# Patient Record
Sex: Male | Born: 1955
Health system: Southern US, Community
[De-identification: ages and names within clinical notes are randomized; demographics above are authoritative.]

## PROBLEM LIST (undated history)

## (undated) DIAGNOSIS — R42 Dizziness and giddiness: Secondary | ICD-10-CM

## (undated) DIAGNOSIS — S76011A Strain of muscle, fascia and tendon of right hip, initial encounter: Secondary | ICD-10-CM

## (undated) DIAGNOSIS — E785 Hyperlipidemia, unspecified: Secondary | ICD-10-CM

## (undated) DIAGNOSIS — G4730 Sleep apnea, unspecified: Secondary | ICD-10-CM

## (undated) DIAGNOSIS — I4891 Unspecified atrial fibrillation: Secondary | ICD-10-CM

## (undated) DIAGNOSIS — F909 Attention-deficit hyperactivity disorder, unspecified type: Secondary | ICD-10-CM

## (undated) DIAGNOSIS — I10 Essential (primary) hypertension: Secondary | ICD-10-CM

## (undated) DIAGNOSIS — K219 Gastro-esophageal reflux disease without esophagitis: Secondary | ICD-10-CM

## (undated) DIAGNOSIS — N529 Male erectile dysfunction, unspecified: Secondary | ICD-10-CM

## (undated) HISTORY — DX: Strain of muscle, fascia and tendon of right hip, initial encounter: S76.011A

## (undated) HISTORY — DX: Unspecified atrial fibrillation: I48.91

## (undated) HISTORY — DX: Hyperlipidemia, unspecified: E78.5

## (undated) HISTORY — DX: Sleep apnea, unspecified: G47.30

## (undated) HISTORY — DX: Male erectile dysfunction, unspecified: N52.9

## (undated) HISTORY — DX: Dizziness and giddiness: R42

## (undated) HISTORY — DX: Attention-deficit hyperactivity disorder, unspecified type: F90.9

## (undated) HISTORY — DX: Gastro-esophageal reflux disease without esophagitis: K21.9

## (undated) HISTORY — DX: Essential (primary) hypertension: I10

---

## 2011-10-29 ENCOUNTER — Ambulatory Visit (HOSPITAL_BASED_OUTPATIENT_CLINIC_OR_DEPARTMENT_OTHER): Payer: BC Managed Care – PPO | Attending: Otolaryngology

## 2011-10-29 VITALS — Ht 67.0 in | Wt 210.0 lb

## 2011-10-29 DIAGNOSIS — G4733 Obstructive sleep apnea (adult) (pediatric): Secondary | ICD-10-CM | POA: Insufficient documentation

## 2011-11-07 DIAGNOSIS — R0609 Other forms of dyspnea: Secondary | ICD-10-CM

## 2011-11-07 DIAGNOSIS — G4733 Obstructive sleep apnea (adult) (pediatric): Secondary | ICD-10-CM

## 2011-11-07 DIAGNOSIS — R0989 Other specified symptoms and signs involving the circulatory and respiratory systems: Secondary | ICD-10-CM

## 2011-11-07 NOTE — Procedures (Signed)
NAME:  Micheal Davila, Micheal Davila NO.:  [id number]  MEDICAL RECORD NO.:  [id number]          PATIENT TYPE:  OUT  LOCATION:  SLEEP CENTER                 FACILITY:  [facility]  PHYSICIAN:  Clinton D. Maple Hudson, MD, FCCP, FACPDATE OF BIRTH:  12-03-55  DATE OF STUDY:  10/29/2011                           NOCTURNAL POLYSOMNOGRAM  REFERRING PHYSICIAN:  Antony Contras, MD  REFERRING PHYSICIAN:  Antony Contras, MD  INDICATION FOR STUDY:  Hypersomnia with sleep apnea.  EPWORTH SLEEPINESS SCORE:  1/24, BMI 32.9, weight 210 pounds, height 67 inches, neck 15 inches.  HOME MEDICATIONS:  Charted and reviewed.  SLEEP ARCHITECTURE:  Total sleep time 341.5 minutes with sleep efficiency 82.7%.  Stage I was 8.9%, stage II 91.1%, stages III and REM were absent.  Sleep latency 2.5 minutes, awake after sleep onset 69 minutes.  Arousal index 41.1.  BEDTIME MEDICATION:  None.  RESPIRATORY DATA:  Apnea/hypopnea index (AHI) 54.1 per hour.  A total of 308 events were scored including 154 obstructive apneas, 16 central apneas, 19 mixed apneas, 119 hypopneas.  Events were more common while supine.  This is a diagnostic NPSG study as ordered and CPAP titration was not done.  OXYGEN DATA:  Loud snoring with oxygen desaturation to a nadir of 71% and mean oxygen saturation through the study of 90.8% on room air.  A total of 97.8 minutes were recorded with room air saturation less than 88% during the study.  CARDIAC DATA:  Sinus rhythm with PVCs.  MOVEMENT/PARASOMNIA:  No significant movement disturbance.  No bathroom trips.  IMPRESSION/RECOMMENDATION: 1. Severe obstructive sleep apnea/hypopnea syndrome, AHI 54.1 per hour     with events more common while supine.  Loud snoring with oxygen     desaturation to a nadir of 71%, mean saturation of 90.8% on room     air through the study and a total of 97.8 minutes recorded with     room air saturation less than 88% through the study. 2. This was a  diagnostic NPSG protocol as ordered.  The patient can     return for dedicated CPAP titration study if appropriate.     Clinton D. Maple Hudson, MD, [facility], FACP Diplomate, American Board of Sleep Medicine    CDY/MEDQ  D:  11/07/2011 09:15:56  T:  11/07/2011 12:27:58  Job:  [id number]

## 2012-06-21 ENCOUNTER — Other Ambulatory Visit: Payer: Self-pay | Admitting: Family Medicine

## 2012-06-21 DIAGNOSIS — K4090 Unilateral inguinal hernia, without obstruction or gangrene, not specified as recurrent: Secondary | ICD-10-CM

## 2012-06-23 ENCOUNTER — Ambulatory Visit
Admission: RE | Admit: 2012-06-23 | Discharge: 2012-06-23 | Disposition: A | Discharge status: Home / Self Care | Payer: BC Managed Care – PPO | Source: Ambulatory Visit | Attending: Family Medicine | Admitting: Family Medicine

## 2012-06-23 DIAGNOSIS — K4090 Unilateral inguinal hernia, without obstruction or gangrene, not specified as recurrent: Secondary | ICD-10-CM

## 2013-01-12 ENCOUNTER — Other Ambulatory Visit: Payer: Self-pay | Admitting: Interventional Cardiology

## 2013-10-30 ENCOUNTER — Encounter: Payer: Self-pay | Admitting: Cardiology

## 2013-10-30 DIAGNOSIS — R42 Dizziness and giddiness: Secondary | ICD-10-CM

## 2013-10-30 DIAGNOSIS — I10 Essential (primary) hypertension: Secondary | ICD-10-CM

## 2013-10-30 DIAGNOSIS — I4891 Unspecified atrial fibrillation: Secondary | ICD-10-CM | POA: Insufficient documentation

## 2013-11-01 ENCOUNTER — Other Ambulatory Visit: Payer: Self-pay | Admitting: Interventional Cardiology

## 2013-11-02 ENCOUNTER — Ambulatory Visit (INDEPENDENT_AMBULATORY_CARE_PROVIDER_SITE_OTHER): Payer: BC Managed Care – PPO | Admitting: Interventional Cardiology

## 2013-11-02 ENCOUNTER — Encounter: Payer: Self-pay | Admitting: Interventional Cardiology

## 2013-11-02 VITALS — BP 126/69 | HR 77 | Ht 67.0 in | Wt 184.0 lb

## 2013-11-02 DIAGNOSIS — I10 Essential (primary) hypertension: Secondary | ICD-10-CM

## 2013-11-02 DIAGNOSIS — I480 Paroxysmal atrial fibrillation: Secondary | ICD-10-CM

## 2013-11-02 NOTE — Patient Instructions (Signed)
Your physician recommends that you continue on your current medications as directed. Please refer to the Current Medication list given to you today.  Your physician wants you to follow-up in: 1 year with Dr. Varanasi. You will receive a reminder letter in the mail two months in advance. If you don't receive a letter, please call our office to schedule the follow-up appointment.  

## 2013-11-02 NOTE — Progress Notes (Signed)
Patient ID: Council Mechanic, male   DOB: 1955-05-17, 58 y.o.   MRN: [id number]    [address] [place], [place]  [address] Phone: [phone] Fax:  [phone]  Date:  11/02/2013   ID:  KAYLER RISE, DOB [birth year], MRN [id number]  PCP:  Beverley Fiedler, MD      History of Present Illness: CHAI ROUTH is a 58 y.o. male who had atrial fluttter/afib astarting about 3 years ago. He had palpitations at the time. He was seen in the ER in 2009 and saw a cardiologist. He wore a monitor and was found to have atrial flutter and had an ablation. He started on flecainide for AFib. No further sx since being on flecainide. Never took COumadin. He does have HTN x 4 years. No diabetes. Many months post ablation, he had an embolus to the left eye. It ws not thought to be related to the ablation per his report. Carotid Doppler did not show any significant stenosis. Negative stress echo in 2009.  He exercises 5 days a week. He does heavy cardio 2-3 days /week and weights 2 days/week. He has lost 40 lbs in the past 6 months through diet and exercise. Atrial Fibrillation F/U:  c/o Dizziness while getting up from sitting position.  Denies : Chest pain.  Leg edema.  Orthopnea.  Palpitations.  Shortness of breath.  Syncope.     Wt Readings from Last 3 Encounters:  11/02/13 184 lb (83.462 kg)  10/29/11 210 lb (95.255 kg)     Past Medical History  Diagnosis Date  . Atrial fibrillation     PAF/aflutter 2000; s/p ablation has been in NSR  . Unspecified essential hypertension   . Dizziness and giddiness   . GERD (gastroesophageal reflux disease)   . Sleep apnea     was on cpap but never used after ENT surgery. He never had post-surg f/u  . Strain of right hip adductor muscle   . Hyperlipidemia   . ADHD (attention deficit hyperactivity disorder)     full eval as an adult though [facility] (not stimulant candidate but can try stratttera)  . ED (erectile dysfunction)      Current Outpatient Prescriptions  Medication Sig Dispense Refill  . aspirin 81 MG tablet Take 81 mg by mouth daily.      . benazepril-hydrochlorthiazide (LOTENSIN HCT) 20-25 MG per tablet Take 1 tablet by mouth daily.      Marland Kitchen buPROPion (WELLBUTRIN XL) 150 MG 24 hr tablet Take 150 mg by mouth daily.      . flecainide (TAMBOCOR) 100 MG tablet TAKE ONE TABLET BY MOUTH TWICE DAILY   180 tablet  2  . Multiple Vitamin (MULTIVITAMIN) tablet Take 1 tablet by mouth daily.      . sildenafil (VIAGRA) 100 MG tablet Take 50 mg by mouth daily as needed for erectile dysfunction.       No current facility-administered medications for this visit.    Allergies:    Allergies  Allergen Reactions  . Strattera [Atomoxetine Hcl] Other (See Comments)    insomnia    Social History:  The patient  reports that he has never smoked. He does not have any smokeless tobacco history on file. He reports that he drinks alcohol. He reports that he does not use illicit drugs.   Family History:  The patient's family history includes Diabetes in his brother and father; Hypertension in his brother, father, and mother.   ROS:  Please  see the history of present illness.  No nausea, vomiting.  No fevers, chills.  No focal weakness.  No dysuria. Tolerating high level exercise.  All other systems reviewed and negative.   PHYSICAL EXAM: VS:  BP 126/69  Pulse 77  Ht 5\' 7"  (1.702 m)  Wt 184 lb (83.462 kg)  BMI 28.81 kg/m2 Well nourished, well developed, in no acute distress HEENT: normal Neck: no JVD, no carotid bruits Cardiac:  normal S1, S2; RRR;  Lungs:  clear to auscultation bilaterally, no wheezing, rhonchi or rales Abd: soft, nontender, no hepatomegaly Ext: no edema Skin: warm and dry Neuro:   no focal abnormalities noted  EKG:    NSR, NSST, QTC 438 ms  ASSESSMENT AND PLAN:  Paroxysmal atrial fibrillation  Continue Aspirin EC Tablet Delayed Release, 81 MG, 1 tablet, Orally, Once a day IMAGING: EKG     Overton,Shana 10/24/2012 09:49:20 AM > Marelly Wehrman,JAY 10/24/2012 09:59:09 AM > NSR, no significant ST segment changes   Notes: No episodes of AFib even with heavy exercise. He could feel sx prior to ablation.  Discussed stronger anticoagulation since his CHADS score is 2. He is feeling well and does not want to change from aspirin a this ime. If he changes his mind, I would consider Eliquis.    2. Hypertension  Continue Lotensin HCT Tablet, 20-25 MG, 1 tablet, Orally, Once a day Notes: Controlled.   3. Dizziness - light-headed  Notes: Used to Ocur with position change. Resolved. Likely related to BP meds. He should stay well hydrated.   LDL 116 in 8/15; up from 89 in 8/14. Diet not as strict, but LDL still in range Preventive Medicine  Adult topics discussed:  Diet: healthy diet.  Exercise: Continue present exercise program.      Signed, Fredric MareJay S. Nailyn Dearinger, MD, [facility] 11/02/2013 1:35 PM

## 2014-05-14 IMAGING — CT CT PELVIS W/O CM
3 series · 17 of 46 positions shown, 20 images · IV contrast (READICAT/WATER)
Comparison: None.

CLINICAL DATA: Bulge of the right inguinal region, evaluate for
hernia

CT PELVIS WITHOUT CONTRAST
TECHNIQUE: Multidetector CT imaging of the pelvis was performed
following the standard protocol without intravenous contrast.

[Series 2: pelvis wo · axial · 0.84mm/px · z∈[-277,-27]mm · 13 of 55 slices shown, 16 images]
[im 4/55  soft-tissue]
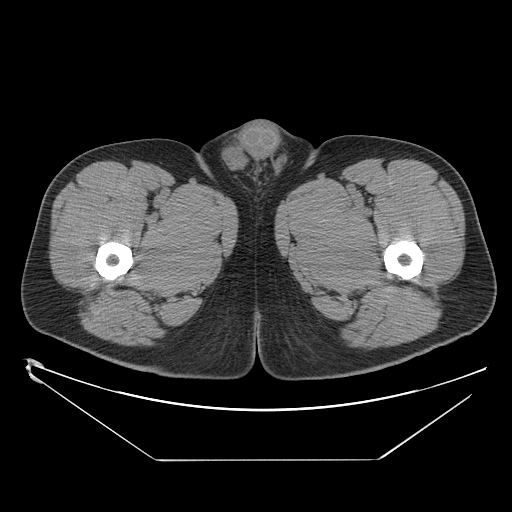
[im 4/55  bone]
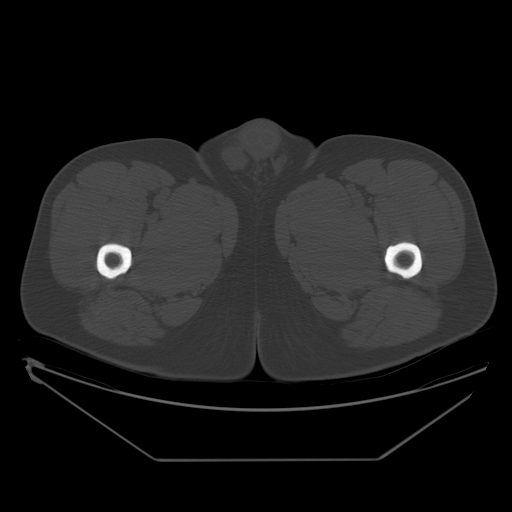
[im 9/55  soft-tissue]
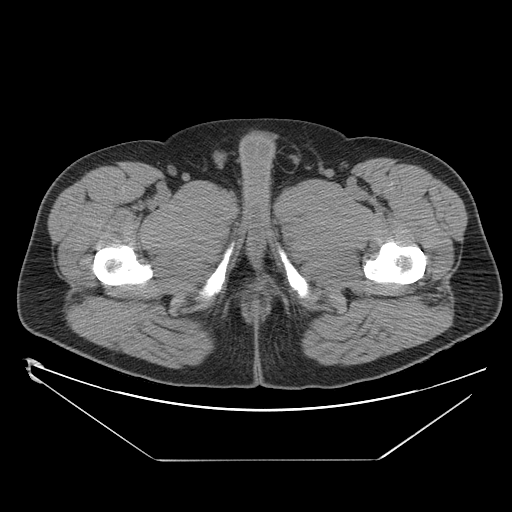
[im 14/55  soft-tissue]
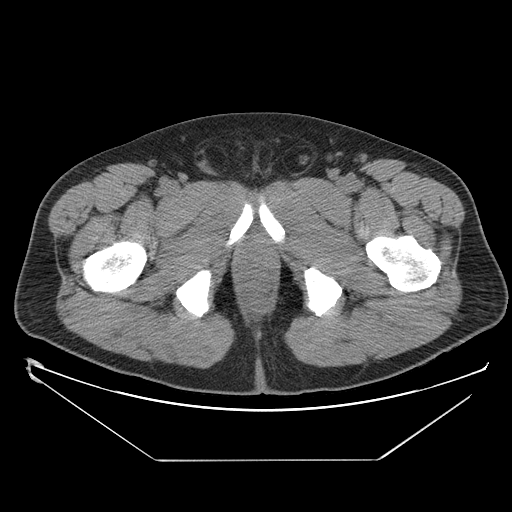
[im 20/55  soft-tissue]
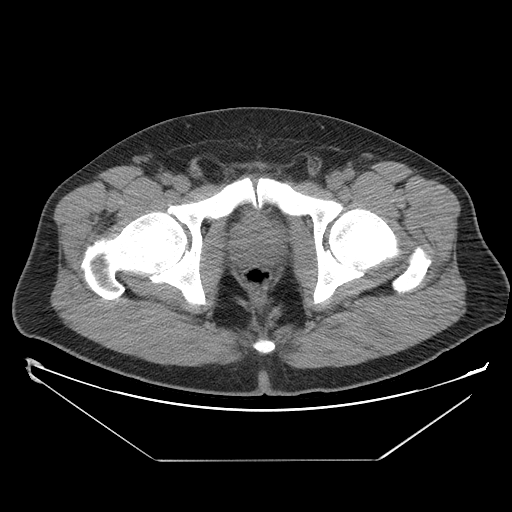
[im 25/55  soft-tissue]
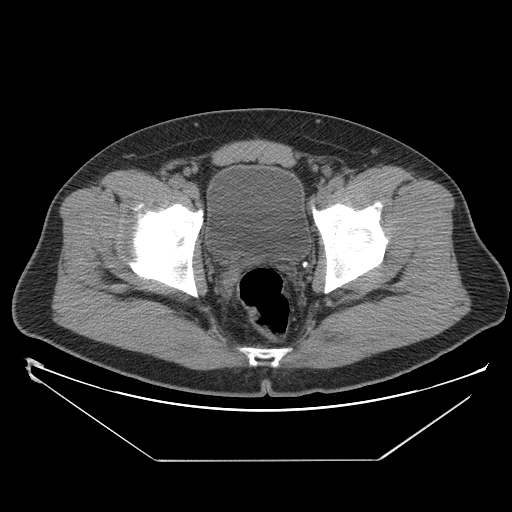
[im 30/55  soft-tissue]
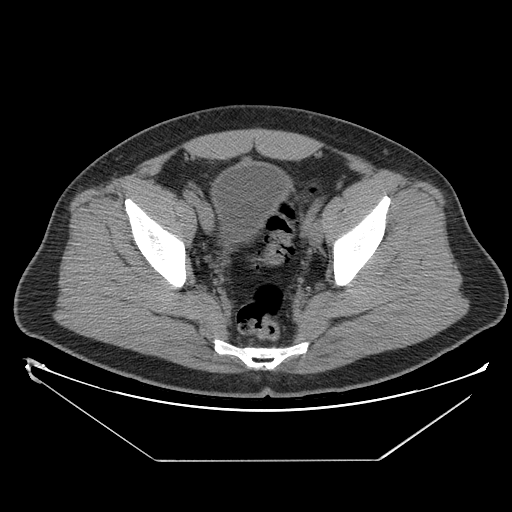
[im 35/55  soft-tissue]
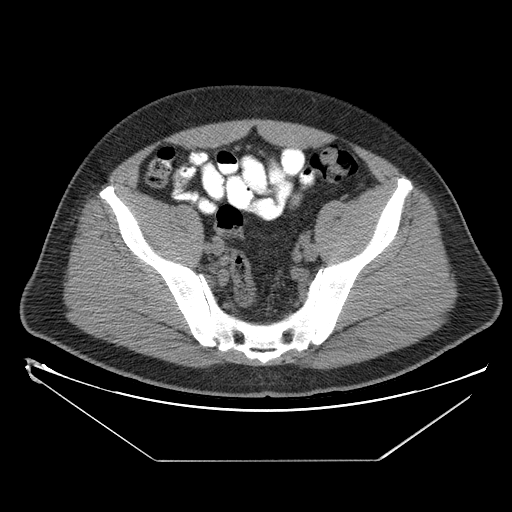
[im 41/55  soft-tissue]
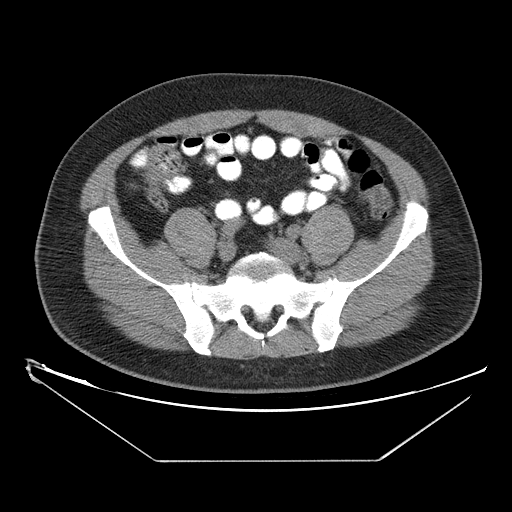
[im 46/55  soft-tissue]
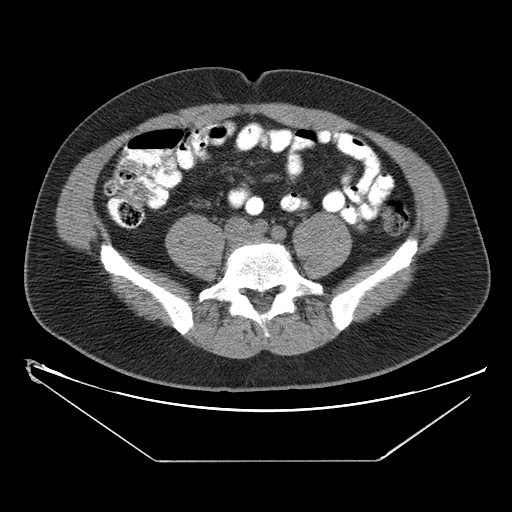
[im 46/55  bone]
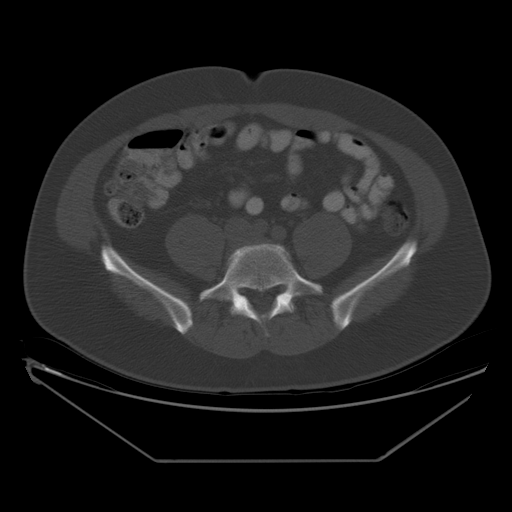
[im 48/55  lung]
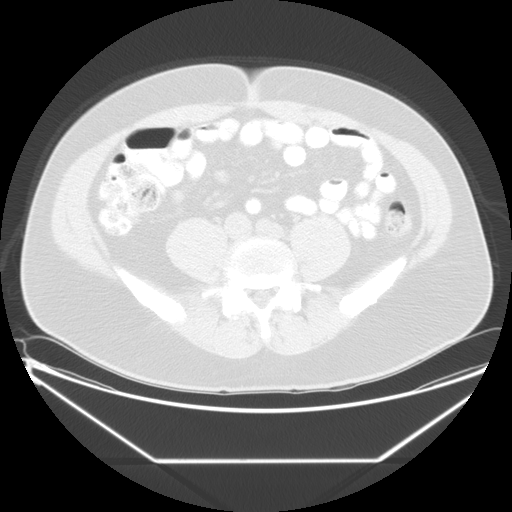
[im 49/55  lung]
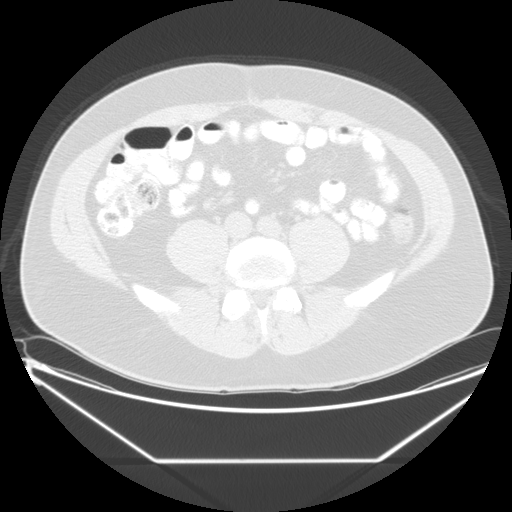
[im 51/55  soft-tissue]
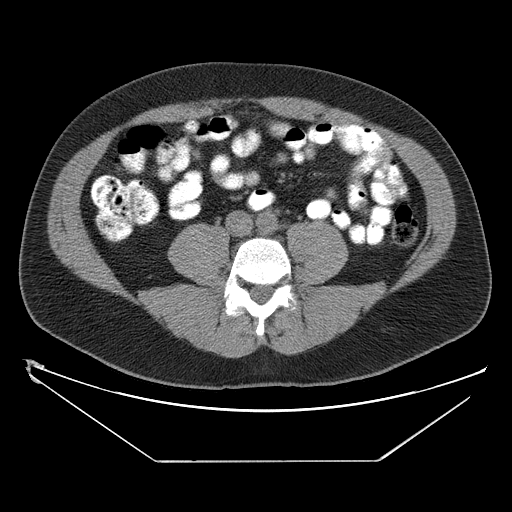
[im 51/55  lung]
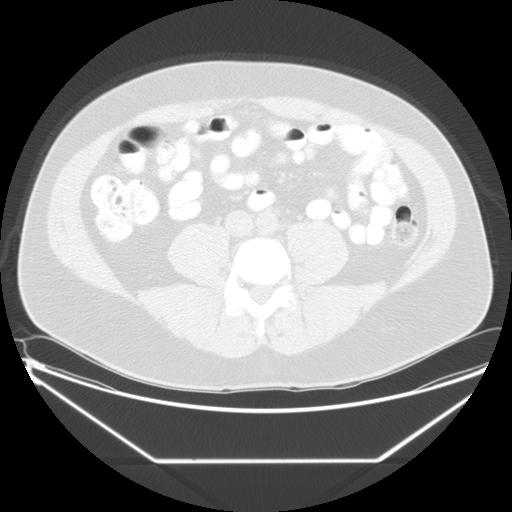
[im 53/55  lung]
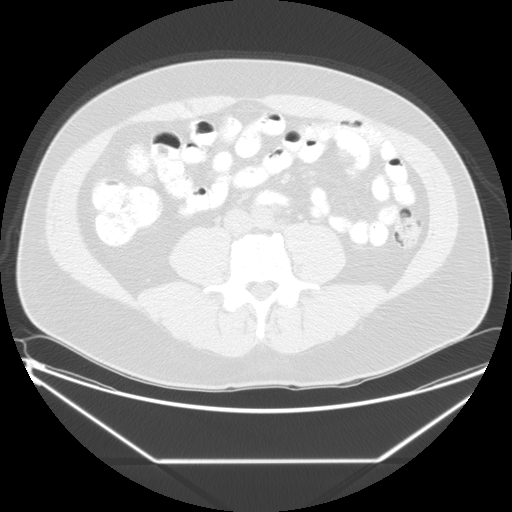

[Series 300: sagittal · sagittal · 0.84mm/px · 1 of 164 slices shown]
[im 55/164  soft-tissue]
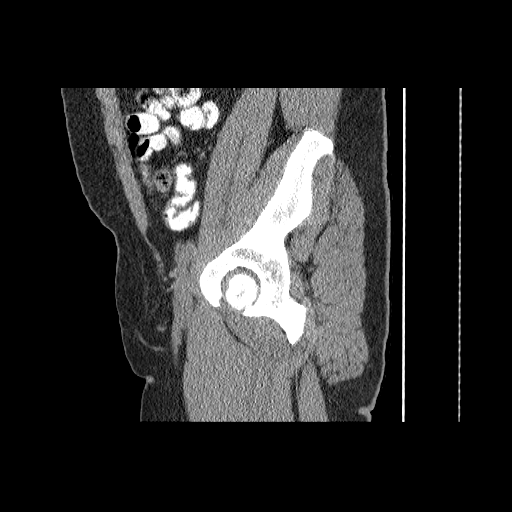

[Series 301: coronal · coronal · 0.84mm/px · 3 of 117 slices shown]
[im 39/117  soft-tissue]
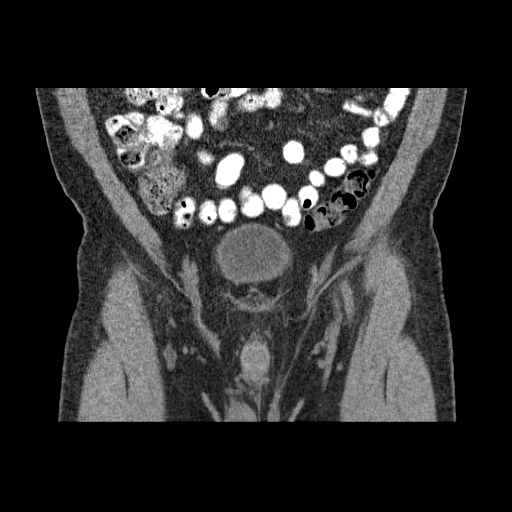
[im 52/117  soft-tissue]
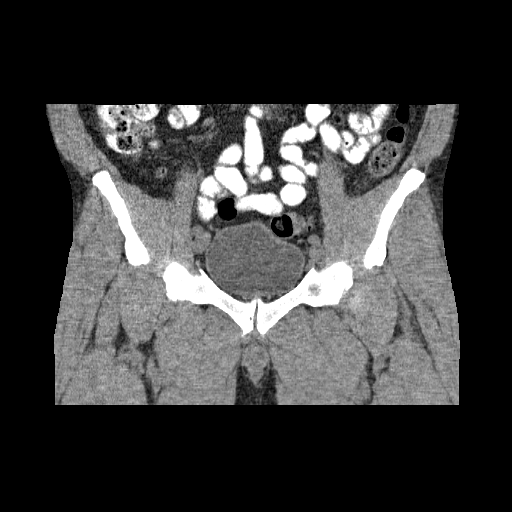
[im 65/117  soft-tissue]
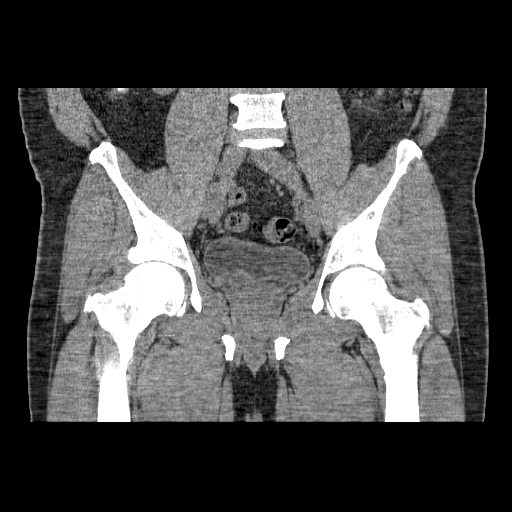

[17 of 46 positions shown; findings below may reference images not displayed]

FINDINGS: There are bilateral inguinal hernias right greater than
left containing only fat.  No bowel enters either of these small
herniations.  The urinary bladder is unremarkable.  The prostate is
normal in size.  No pelvic adenopathy is seen.  No bony abnormality
is seen.  There is some degenerative change of the facet joints of
the lower lumbar spine.
IMPRESSION: 1.  There are small bilateral inguinal hernias containing only fat.
2.  Degenerative change of the facet joints of the lower lumbar
spine

## 2014-11-09 ENCOUNTER — Other Ambulatory Visit: Payer: Self-pay | Admitting: Interventional Cardiology

## 2014-11-26 ENCOUNTER — Ambulatory Visit (INDEPENDENT_AMBULATORY_CARE_PROVIDER_SITE_OTHER): Payer: BLUE CROSS/BLUE SHIELD | Admitting: Interventional Cardiology

## 2014-11-26 ENCOUNTER — Encounter: Payer: Self-pay | Admitting: Interventional Cardiology

## 2014-11-26 VITALS — BP 122/55 | HR 75 | Ht 67.0 in | Wt 188.0 lb

## 2014-11-26 DIAGNOSIS — I10 Essential (primary) hypertension: Secondary | ICD-10-CM | POA: Diagnosis not present

## 2014-11-26 DIAGNOSIS — I480 Paroxysmal atrial fibrillation: Secondary | ICD-10-CM | POA: Diagnosis not present

## 2014-11-26 MED ORDER — FLECAINIDE ACETATE 100 MG PO TABS: 100.0000 mg | ORAL_TABLET | Freq: Two times a day (BID) | ORAL | Status: DC

## 2014-11-26 NOTE — Progress Notes (Signed)
Patient ID: Micheal Mechanicichard A Davila, male   DOB: 1955/08/13, 59 y.o.   MRN: [id number]     Cardiology Office Note   Date:  11/26/2014   ID:  Micheal MechanicRichard A Sieben, DOB 1955/08/13, MRN [id number]  PCP:  Beverley FiedlerANKINS,VICTORIA, MD    No chief complaint on file.  atrial fibrillation   Wt Readings from Last 3 Encounters:  11/26/14 188 lb (85.276 kg)  11/02/13 184 lb (83.462 kg)  10/29/11 210 lb (95.255 kg)       History of Present Illness: Micheal MechanicRichard A Rigdon is a 59 y.o. male  who had atrial fluttter/afib astarting about 3 years ago. He had palpitations at the time. He was seen in the ER in 2009 and saw a cardiologist. He wore a monitor and was found to have atrial flutter and had an ablation. He started on flecainide for AFib. No further sx since being on flecainide. Never took COumadin. He does have HTN since 2011. No diabetes. Many months post ablation, he had an embolus to the left eye. It ws not thought to be related to the ablation per his report. Carotid Doppler did not show any significant stenosis. Negative stress echo in 2009.  He exercises 5-6 days a week. He does heavy cardio 2-3 days /week and weights 2 days/week. He has lost 40 lbs in the past 6 months through diet and exercise. Atrial Fibrillation F/U:   Denies : Chest pain.  Leg edema.  Orthopnea.  Palpitations.  Shortness of breath.  Syncope.   Feels well during his workouts.  Past Medical History  Diagnosis Date  . Atrial fibrillation (HCC)     PAF/aflutter 2000; s/p ablation has been in NSR  . Unspecified essential hypertension   . Dizziness and giddiness   . GERD (gastroesophageal reflux disease)   . Sleep apnea     was on cpap but never used after ENT surgery. He never had post-surg f/u  . Strain of right hip adductor muscle   . Hyperlipidemia   . ADHD (attention deficit hyperactivity disorder)     full eval as an adult though [facility] (not stimulant candidate but can try stratttera)  . ED (erectile dysfunction)      History reviewed. No pertinent past surgical history.   Current Outpatient Prescriptions  Medication Sig Dispense Refill  . aspirin 81 MG tablet Take 81 mg by mouth daily.    . benazepril-hydrochlorthiazide (LOTENSIN HCT) 20-25 MG per tablet Take 1 tablet by mouth daily.    Marland Kitchen. buPROPion (WELLBUTRIN XL) 150 MG 24 hr tablet Take 150 mg by mouth daily.    . flecainide (TAMBOCOR) 100 MG tablet TAKE ONE TABLET BY MOUTH TWICE DAILY 30 tablet 0  . Multiple Vitamin (MULTIVITAMIN) tablet Take 1 tablet by mouth daily.     No current facility-administered medications for this visit.    Allergies:   Strattera    Social History:  The patient  reports that he has never smoked. He does not have any smokeless tobacco history on file. He reports that he drinks alcohol. He reports that he does not use illicit drugs.   Family History:  The patient's family history includes Diabetes in his brother and father; Heart attack in his father; Hypertension in his brother, father, and mother. There is no history of Stroke.    ROS:  Please see the history of present illness.   Otherwise, review of systems are positive for intentional weight loss during the last few years.   All other  systems are reviewed and negative.    PHYSICAL EXAM: VS:  BP 122/55 mmHg  Pulse 75  Ht  (1.702 m)  Wt 188 lb (85.276 kg)  BMI 29.44 kg/m2 , BMI Body mass index is 29.44 kg/(m^2). GEN: Well nourished, well developed, in no acute distress HEENT: normal Neck: no JVD, carotid bruits, or masses Cardiac: RRR; no murmurs, rubs, or gallops,no edema  Respiratory:  clear to auscultation bilaterally, normal work of breathing GI: soft, nontender, nondistended, + BS MS: no deformity or atrophy Skin: warm and dry, no rash Neuro:  Strength and sensation are intact Psych: euthymic mood, full affect   EKG:   The ekg ordered today demonstrates normal sinus rhythm, nonspecific ST segment changes inferiorly, normal QT  interval   Recent Labs: No results found for requested labs within last 365 days.   Lipid Panel No results found for: CHOL, TRIG, HDL, CHOLHDL, VLDL, LDLCALC, LDLDIRECT   Other studies Reviewed: Additional studies/ records that were reviewed today with results demonstrating: Old ECG .   ASSESSMENT AND PLAN:  1. AFib: Maintaining sinus rhythm. Continue flecainide. He has been on flecainide without any rate slowing drugs for many years, even before he moved [place]. He has had an atrial flutter ablation in the past. Continue aspirin. Chads Vasc score 1.  2. Hypertension: Well controlled. 3. Continue current exercise regimen.   Current medicines are reviewed at length with the patient today.  The patient concerns regarding his medicines were addressed.  The following changes have been made:  No change  Labs/ tests ordered today include: Obtain results from Dr. Barbaraann Barthel  No orders of the defined types were placed in this encounter.    Recommend 150 minutes/week of aerobic exercise Low fat, low carb, high fiber diet recommended  Disposition:   FU in 1 year   Delorise Jackson., MD  11/26/2014 2:52 PM    [facility] [address] [place], [place], [place]  [address] Phone: [phone]; Fax: [phone]

## 2014-11-26 NOTE — Patient Instructions (Signed)

## 2015-07-11 DIAGNOSIS — M7542 Impingement syndrome of left shoulder: Secondary | ICD-10-CM | POA: Diagnosis not present

## 2015-07-11 DIAGNOSIS — M7541 Impingement syndrome of right shoulder: Secondary | ICD-10-CM | POA: Diagnosis not present

## 2015-07-11 DIAGNOSIS — M9903 Segmental and somatic dysfunction of lumbar region: Secondary | ICD-10-CM | POA: Diagnosis not present

## 2015-07-11 DIAGNOSIS — M5431 Sciatica, right side: Secondary | ICD-10-CM | POA: Diagnosis not present

## 2015-07-11 DIAGNOSIS — M9905 Segmental and somatic dysfunction of pelvic region: Secondary | ICD-10-CM | POA: Diagnosis not present

## 2015-07-11 DIAGNOSIS — M9901 Segmental and somatic dysfunction of cervical region: Secondary | ICD-10-CM | POA: Diagnosis not present

## 2015-07-11 DIAGNOSIS — M9902 Segmental and somatic dysfunction of thoracic region: Secondary | ICD-10-CM | POA: Diagnosis not present

## 2015-07-11 DIAGNOSIS — M791 Myalgia: Secondary | ICD-10-CM | POA: Diagnosis not present

## 2015-07-16 DIAGNOSIS — M5431 Sciatica, right side: Secondary | ICD-10-CM | POA: Diagnosis not present

## 2015-07-16 DIAGNOSIS — M9905 Segmental and somatic dysfunction of pelvic region: Secondary | ICD-10-CM | POA: Diagnosis not present

## 2015-07-16 DIAGNOSIS — M7541 Impingement syndrome of right shoulder: Secondary | ICD-10-CM | POA: Diagnosis not present

## 2015-07-16 DIAGNOSIS — M7542 Impingement syndrome of left shoulder: Secondary | ICD-10-CM | POA: Diagnosis not present

## 2015-07-16 DIAGNOSIS — M9903 Segmental and somatic dysfunction of lumbar region: Secondary | ICD-10-CM | POA: Diagnosis not present

## 2015-07-18 DIAGNOSIS — M7542 Impingement syndrome of left shoulder: Secondary | ICD-10-CM | POA: Diagnosis not present

## 2015-07-18 DIAGNOSIS — M791 Myalgia: Secondary | ICD-10-CM | POA: Diagnosis not present

## 2015-07-18 DIAGNOSIS — M9905 Segmental and somatic dysfunction of pelvic region: Secondary | ICD-10-CM | POA: Diagnosis not present

## 2015-07-18 DIAGNOSIS — M5431 Sciatica, right side: Secondary | ICD-10-CM | POA: Diagnosis not present

## 2015-07-18 DIAGNOSIS — M7541 Impingement syndrome of right shoulder: Secondary | ICD-10-CM | POA: Diagnosis not present

## 2015-07-18 DIAGNOSIS — M9903 Segmental and somatic dysfunction of lumbar region: Secondary | ICD-10-CM | POA: Diagnosis not present

## 2015-07-23 DIAGNOSIS — M9903 Segmental and somatic dysfunction of lumbar region: Secondary | ICD-10-CM | POA: Diagnosis not present

## 2015-07-23 DIAGNOSIS — M7542 Impingement syndrome of left shoulder: Secondary | ICD-10-CM | POA: Diagnosis not present

## 2015-07-23 DIAGNOSIS — M7541 Impingement syndrome of right shoulder: Secondary | ICD-10-CM | POA: Diagnosis not present

## 2015-07-23 DIAGNOSIS — M5431 Sciatica, right side: Secondary | ICD-10-CM | POA: Diagnosis not present

## 2015-07-25 DIAGNOSIS — M9903 Segmental and somatic dysfunction of lumbar region: Secondary | ICD-10-CM | POA: Diagnosis not present

## 2015-07-25 DIAGNOSIS — M7541 Impingement syndrome of right shoulder: Secondary | ICD-10-CM | POA: Diagnosis not present

## 2015-07-25 DIAGNOSIS — M5431 Sciatica, right side: Secondary | ICD-10-CM | POA: Diagnosis not present

## 2015-07-25 DIAGNOSIS — M9901 Segmental and somatic dysfunction of cervical region: Secondary | ICD-10-CM | POA: Diagnosis not present

## 2015-07-25 DIAGNOSIS — M7542 Impingement syndrome of left shoulder: Secondary | ICD-10-CM | POA: Diagnosis not present

## 2015-07-30 DIAGNOSIS — M5431 Sciatica, right side: Secondary | ICD-10-CM | POA: Diagnosis not present

## 2015-07-30 DIAGNOSIS — M9903 Segmental and somatic dysfunction of lumbar region: Secondary | ICD-10-CM | POA: Diagnosis not present

## 2015-07-30 DIAGNOSIS — M9905 Segmental and somatic dysfunction of pelvic region: Secondary | ICD-10-CM | POA: Diagnosis not present

## 2015-07-30 DIAGNOSIS — M791 Myalgia: Secondary | ICD-10-CM | POA: Diagnosis not present

## 2015-07-30 DIAGNOSIS — M7541 Impingement syndrome of right shoulder: Secondary | ICD-10-CM | POA: Diagnosis not present

## 2015-07-30 DIAGNOSIS — M9901 Segmental and somatic dysfunction of cervical region: Secondary | ICD-10-CM | POA: Diagnosis not present

## 2015-08-01 DIAGNOSIS — M9903 Segmental and somatic dysfunction of lumbar region: Secondary | ICD-10-CM | POA: Diagnosis not present

## 2015-08-01 DIAGNOSIS — M9901 Segmental and somatic dysfunction of cervical region: Secondary | ICD-10-CM | POA: Diagnosis not present

## 2015-08-01 DIAGNOSIS — M9905 Segmental and somatic dysfunction of pelvic region: Secondary | ICD-10-CM | POA: Diagnosis not present

## 2015-08-01 DIAGNOSIS — M7542 Impingement syndrome of left shoulder: Secondary | ICD-10-CM | POA: Diagnosis not present

## 2015-08-01 DIAGNOSIS — M5431 Sciatica, right side: Secondary | ICD-10-CM | POA: Diagnosis not present

## 2015-08-08 DIAGNOSIS — M5431 Sciatica, right side: Secondary | ICD-10-CM | POA: Diagnosis not present

## 2015-08-08 DIAGNOSIS — M9901 Segmental and somatic dysfunction of cervical region: Secondary | ICD-10-CM | POA: Diagnosis not present

## 2015-08-08 DIAGNOSIS — M7541 Impingement syndrome of right shoulder: Secondary | ICD-10-CM | POA: Diagnosis not present

## 2015-08-08 DIAGNOSIS — M9905 Segmental and somatic dysfunction of pelvic region: Secondary | ICD-10-CM | POA: Diagnosis not present

## 2015-08-08 DIAGNOSIS — M7542 Impingement syndrome of left shoulder: Secondary | ICD-10-CM | POA: Diagnosis not present

## 2015-08-08 DIAGNOSIS — M791 Myalgia: Secondary | ICD-10-CM | POA: Diagnosis not present

## 2015-08-08 DIAGNOSIS — M9902 Segmental and somatic dysfunction of thoracic region: Secondary | ICD-10-CM | POA: Diagnosis not present

## 2015-08-08 DIAGNOSIS — M9903 Segmental and somatic dysfunction of lumbar region: Secondary | ICD-10-CM | POA: Diagnosis not present

## 2015-09-10 DIAGNOSIS — M7541 Impingement syndrome of right shoulder: Secondary | ICD-10-CM | POA: Diagnosis not present

## 2015-09-10 DIAGNOSIS — M9902 Segmental and somatic dysfunction of thoracic region: Secondary | ICD-10-CM | POA: Diagnosis not present

## 2015-09-10 DIAGNOSIS — M9905 Segmental and somatic dysfunction of pelvic region: Secondary | ICD-10-CM | POA: Diagnosis not present

## 2015-09-10 DIAGNOSIS — M9903 Segmental and somatic dysfunction of lumbar region: Secondary | ICD-10-CM | POA: Diagnosis not present

## 2015-09-10 DIAGNOSIS — M5431 Sciatica, right side: Secondary | ICD-10-CM | POA: Diagnosis not present

## 2015-09-12 DIAGNOSIS — M9903 Segmental and somatic dysfunction of lumbar region: Secondary | ICD-10-CM | POA: Diagnosis not present

## 2015-09-12 DIAGNOSIS — M9902 Segmental and somatic dysfunction of thoracic region: Secondary | ICD-10-CM | POA: Diagnosis not present

## 2015-09-12 DIAGNOSIS — M7542 Impingement syndrome of left shoulder: Secondary | ICD-10-CM | POA: Diagnosis not present

## 2015-09-12 DIAGNOSIS — M791 Myalgia: Secondary | ICD-10-CM | POA: Diagnosis not present

## 2015-09-12 DIAGNOSIS — M9905 Segmental and somatic dysfunction of pelvic region: Secondary | ICD-10-CM | POA: Diagnosis not present

## 2015-09-12 DIAGNOSIS — M7541 Impingement syndrome of right shoulder: Secondary | ICD-10-CM | POA: Diagnosis not present

## 2015-09-12 DIAGNOSIS — M5431 Sciatica, right side: Secondary | ICD-10-CM | POA: Diagnosis not present

## 2015-09-12 DIAGNOSIS — M9901 Segmental and somatic dysfunction of cervical region: Secondary | ICD-10-CM | POA: Diagnosis not present

## 2015-09-19 DIAGNOSIS — M9903 Segmental and somatic dysfunction of lumbar region: Secondary | ICD-10-CM | POA: Diagnosis not present

## 2015-09-19 DIAGNOSIS — M9905 Segmental and somatic dysfunction of pelvic region: Secondary | ICD-10-CM | POA: Diagnosis not present

## 2015-09-19 DIAGNOSIS — M5431 Sciatica, right side: Secondary | ICD-10-CM | POA: Diagnosis not present

## 2015-09-19 DIAGNOSIS — M9902 Segmental and somatic dysfunction of thoracic region: Secondary | ICD-10-CM | POA: Diagnosis not present

## 2015-09-19 DIAGNOSIS — M7542 Impingement syndrome of left shoulder: Secondary | ICD-10-CM | POA: Diagnosis not present

## 2015-09-26 DIAGNOSIS — M9902 Segmental and somatic dysfunction of thoracic region: Secondary | ICD-10-CM | POA: Diagnosis not present

## 2015-09-26 DIAGNOSIS — M9905 Segmental and somatic dysfunction of pelvic region: Secondary | ICD-10-CM | POA: Diagnosis not present

## 2015-09-26 DIAGNOSIS — M9903 Segmental and somatic dysfunction of lumbar region: Secondary | ICD-10-CM | POA: Diagnosis not present

## 2015-09-26 DIAGNOSIS — M5431 Sciatica, right side: Secondary | ICD-10-CM | POA: Diagnosis not present

## 2015-09-26 DIAGNOSIS — M7541 Impingement syndrome of right shoulder: Secondary | ICD-10-CM | POA: Diagnosis not present

## 2015-09-30 DIAGNOSIS — Z0000 Encounter for general adult medical examination without abnormal findings: Secondary | ICD-10-CM | POA: Diagnosis not present

## 2015-09-30 DIAGNOSIS — Z125 Encounter for screening for malignant neoplasm of prostate: Secondary | ICD-10-CM | POA: Diagnosis not present

## 2015-09-30 DIAGNOSIS — I10 Essential (primary) hypertension: Secondary | ICD-10-CM | POA: Diagnosis not present

## 2015-09-30 DIAGNOSIS — Z23 Encounter for immunization: Secondary | ICD-10-CM | POA: Diagnosis not present

## 2015-09-30 DIAGNOSIS — I480 Paroxysmal atrial fibrillation: Secondary | ICD-10-CM | POA: Diagnosis not present

## 2015-10-03 DIAGNOSIS — M5431 Sciatica, right side: Secondary | ICD-10-CM | POA: Diagnosis not present

## 2015-10-03 DIAGNOSIS — M9901 Segmental and somatic dysfunction of cervical region: Secondary | ICD-10-CM | POA: Diagnosis not present

## 2015-10-03 DIAGNOSIS — M791 Myalgia: Secondary | ICD-10-CM | POA: Diagnosis not present

## 2015-10-03 DIAGNOSIS — M9905 Segmental and somatic dysfunction of pelvic region: Secondary | ICD-10-CM | POA: Diagnosis not present

## 2015-10-03 DIAGNOSIS — M7542 Impingement syndrome of left shoulder: Secondary | ICD-10-CM | POA: Diagnosis not present

## 2015-10-03 DIAGNOSIS — M9902 Segmental and somatic dysfunction of thoracic region: Secondary | ICD-10-CM | POA: Diagnosis not present

## 2015-10-03 DIAGNOSIS — M9903 Segmental and somatic dysfunction of lumbar region: Secondary | ICD-10-CM | POA: Diagnosis not present

## 2015-10-03 DIAGNOSIS — M7541 Impingement syndrome of right shoulder: Secondary | ICD-10-CM | POA: Diagnosis not present

## 2015-10-10 DIAGNOSIS — M5431 Sciatica, right side: Secondary | ICD-10-CM | POA: Diagnosis not present

## 2015-10-10 DIAGNOSIS — M7541 Impingement syndrome of right shoulder: Secondary | ICD-10-CM | POA: Diagnosis not present

## 2015-10-10 DIAGNOSIS — M7542 Impingement syndrome of left shoulder: Secondary | ICD-10-CM | POA: Diagnosis not present

## 2015-10-10 DIAGNOSIS — M9903 Segmental and somatic dysfunction of lumbar region: Secondary | ICD-10-CM | POA: Diagnosis not present

## 2015-10-10 DIAGNOSIS — M791 Myalgia: Secondary | ICD-10-CM | POA: Diagnosis not present

## 2015-10-10 DIAGNOSIS — M9905 Segmental and somatic dysfunction of pelvic region: Secondary | ICD-10-CM | POA: Diagnosis not present

## 2015-10-10 DIAGNOSIS — M9902 Segmental and somatic dysfunction of thoracic region: Secondary | ICD-10-CM | POA: Diagnosis not present

## 2015-10-10 DIAGNOSIS — M9901 Segmental and somatic dysfunction of cervical region: Secondary | ICD-10-CM | POA: Diagnosis not present

## 2015-10-17 DIAGNOSIS — M9901 Segmental and somatic dysfunction of cervical region: Secondary | ICD-10-CM | POA: Diagnosis not present

## 2015-10-17 DIAGNOSIS — M9903 Segmental and somatic dysfunction of lumbar region: Secondary | ICD-10-CM | POA: Diagnosis not present

## 2015-10-17 DIAGNOSIS — M9905 Segmental and somatic dysfunction of pelvic region: Secondary | ICD-10-CM | POA: Diagnosis not present

## 2015-10-17 DIAGNOSIS — M5431 Sciatica, right side: Secondary | ICD-10-CM | POA: Diagnosis not present

## 2015-10-17 DIAGNOSIS — M7542 Impingement syndrome of left shoulder: Secondary | ICD-10-CM | POA: Diagnosis not present

## 2015-10-31 DIAGNOSIS — M7542 Impingement syndrome of left shoulder: Secondary | ICD-10-CM | POA: Diagnosis not present

## 2015-10-31 DIAGNOSIS — M9901 Segmental and somatic dysfunction of cervical region: Secondary | ICD-10-CM | POA: Diagnosis not present

## 2015-10-31 DIAGNOSIS — M5431 Sciatica, right side: Secondary | ICD-10-CM | POA: Diagnosis not present

## 2015-10-31 DIAGNOSIS — M9902 Segmental and somatic dysfunction of thoracic region: Secondary | ICD-10-CM | POA: Diagnosis not present

## 2015-10-31 DIAGNOSIS — M9905 Segmental and somatic dysfunction of pelvic region: Secondary | ICD-10-CM | POA: Diagnosis not present

## 2015-10-31 DIAGNOSIS — M9903 Segmental and somatic dysfunction of lumbar region: Secondary | ICD-10-CM | POA: Diagnosis not present

## 2015-11-07 DIAGNOSIS — M5431 Sciatica, right side: Secondary | ICD-10-CM | POA: Diagnosis not present

## 2015-11-07 DIAGNOSIS — M791 Myalgia: Secondary | ICD-10-CM | POA: Diagnosis not present

## 2015-11-07 DIAGNOSIS — M9902 Segmental and somatic dysfunction of thoracic region: Secondary | ICD-10-CM | POA: Diagnosis not present

## 2015-11-07 DIAGNOSIS — M9905 Segmental and somatic dysfunction of pelvic region: Secondary | ICD-10-CM | POA: Diagnosis not present

## 2015-11-07 DIAGNOSIS — M7542 Impingement syndrome of left shoulder: Secondary | ICD-10-CM | POA: Diagnosis not present

## 2015-11-07 DIAGNOSIS — M9901 Segmental and somatic dysfunction of cervical region: Secondary | ICD-10-CM | POA: Diagnosis not present

## 2015-11-07 DIAGNOSIS — M9903 Segmental and somatic dysfunction of lumbar region: Secondary | ICD-10-CM | POA: Diagnosis not present

## 2015-11-07 DIAGNOSIS — M7541 Impingement syndrome of right shoulder: Secondary | ICD-10-CM | POA: Diagnosis not present

## 2015-11-28 DIAGNOSIS — M5431 Sciatica, right side: Secondary | ICD-10-CM | POA: Diagnosis not present

## 2015-11-28 DIAGNOSIS — M9903 Segmental and somatic dysfunction of lumbar region: Secondary | ICD-10-CM | POA: Diagnosis not present

## 2015-11-28 DIAGNOSIS — M9902 Segmental and somatic dysfunction of thoracic region: Secondary | ICD-10-CM | POA: Diagnosis not present

## 2015-11-28 DIAGNOSIS — M9901 Segmental and somatic dysfunction of cervical region: Secondary | ICD-10-CM | POA: Diagnosis not present

## 2015-12-11 NOTE — Progress Notes (Signed)
Patient ID: Micheal Davila, male   DOB: August 27, 1955, 60 y.o.   MRN: [id number]     Cardiology Office Note   Date:  12/12/2015   ID:  Micheal MechanicRichard A Kellen, DOB August 27, 1955, MRN [id number]  PCP:  Clayborn HeronVictoria R Rankins, MD    Chief Complaint  Patient presents with  . Atrial Fibrillation  . Hypertension   atrial fibrillation   Wt Readings from Last 3 Encounters:  12/12/15 92 kg (202 lb 12.8 oz)  11/26/14 85.3 kg (188 lb)  11/02/13 83.5 kg (184 lb)       History of Present Illness: Micheal MechanicRichard A Minor is a 60 y.o. male  who had atrial fluttter/afib starting many years ago. He had palpitations at the time. He was seen in the ER in 2009 and saw a cardiologist. He wore a monitor and was found to have atrial flutter and had an ablation, [place]. He started on flecainide for AFib. No further sx since being on flecainide. Never took COumadin. He does have HTN since 2011. No diabetes. Many months post ablation, he had an embolus to the left eye. It ws not thought to be related to the ablation per his report. Carotid Doppler did not show any significant stenosis. Negative stress echo in 2009.  He exercises 5-6 days a week. He does heavy cardio 2-3 days /week and weights 2 days/week. He has lost 40 lbs in the past 6 months through diet and exercise. Atrial Fibrillation F/U:   Denies : Chest pain. Leg edema. Orthopnea. Palpitations. Shortness of breath. Syncope.   Feels well during his workouts.  He get his HR high.  He does this 4-5 x/week.  HR up to 173.  He does forget the second dose of his flecainide at times.    Past Medical History:  Diagnosis Date  . ADHD (attention deficit hyperactivity disorder)    full eval as an adult though [facility] (not stimulant candidate but can try stratttera)  . Atrial fibrillation (HCC)    PAF/aflutter 2000; s/p ablation has been in NSR  . Dizziness and giddiness   . ED (erectile dysfunction)   . GERD (gastroesophageal reflux disease)   .  Hyperlipidemia   . Sleep apnea    was on cpap but never used after ENT surgery. He never had post-surg f/u  . Strain of right hip adductor muscle   . Unspecified essential hypertension     History reviewed. No pertinent surgical history.   Current Outpatient Prescriptions  Medication Sig Dispense Refill  . aspirin 81 MG tablet Take 81 mg by mouth daily.    . benazepril-hydrochlorthiazide (LOTENSIN HCT) 20-25 MG per tablet Take 1 tablet by mouth daily.    Marland Kitchen. buPROPion (WELLBUTRIN XL) 150 MG 24 hr tablet Take 150 mg by mouth daily.    . flecainide (TAMBOCOR) 100 MG tablet Take 1 tablet (100 mg total) by mouth 2 (two) times daily. 180 tablet 3  . Multiple Vitamin (MULTIVITAMIN) tablet Take 1 tablet by mouth daily.     No current facility-administered medications for this visit.     Allergies:   Strattera [atomoxetine hcl] - insomnia   Social History:  The patient  reports that he has never smoked. He has never used smokeless tobacco. He reports that he drinks alcohol. He reports that he does not use drugs.   Family History:  The patient's family history includes Diabetes in his brother and father; Heart attack in his father; Hypertension in his brother, father,  and mother.    ROS:  Please see the history of present illness.   Otherwise, review of systems are positive for intentional weight loss during the last few years.   All other systems are reviewed and negative.    PHYSICAL EXAM: VS:  BP 130/70   Pulse 93   Ht 5\' 7"  (1.702 m)   Wt 92 kg (202 lb 12.8 oz)   SpO2 97%   BMI 31.76 kg/m  , BMI Body mass index is 31.76 kg/m. GEN: Well nourished, well developed, in no acute distress  HEENT: normal  Neck: no JVD, carotid bruits, or masses Cardiac: RRR; no murmurs, rubs, or gallops,no edema  Respiratory:  clear to auscultation bilaterally, normal work of breathing, 2+ PT pulses bilaterally GI: soft, nontender, nondistended, + BS MS: no deformity or atrophy  Skin: warm and dry,  no rash Neuro:  Strength and sensation are intact Psych: euthymic mood, full affect   EKG:   The ekg ordered today demonstrates normal sinus rhythm, nonspecific ST segment changes inferiorly, normal QT interval   Recent Labs: No results found for requested labs within last 8760 hours.   Lipid Panel No results found for: CHOL, TRIG, HDL, CHOLHDL, VLDL, LDLCALC, LDLDIRECT   Other studies Reviewed: Additional studies/ records that were reviewed today with results demonstrating: Old ECG .   ASSESSMENT AND PLAN:  1. AFib: Maintaining sinus rhythm. Continue flecainide- being used for atrial fibrillation. He has been on flecainide without any rate slowing drugs for many years, even before he moved [place]. He has had an atrial flutter ablation in the past. Continue aspirin. Chads Vasc score 1. Would not stop flecainide.  Also, patient with h/o of retinal artery embolus shortly after flutter ablation. He has declined stronger anticoagulation.   2. Hypertension: Well controlled.  He would like to decrease his BP med.  Highest BP reading at home has been 135 mm Hg.  Often, he has readings in the 120s.  Decrease to benazepril/HCTZ to 10/12.5 mg daily.   If BP increases, will go back to the full tab and let us know. 3. Continue current exercise regimen.   Current medicines are reviewed at length with the patient today.  The patient concerns regarding his medicines were addressed.  The following changes have been made:  No change  Labs/ tests ordered today include: Obtain results from Dr. Barbaraann Barthelankins   Orders Placed This Encounter  Procedures  . EKG 12-Lead    Recommend 150 minutes/week of aerobic exercise Low fat, low carb, high fiber diet recommended  Disposition:   FU in 1 year   Signed, Lance MussJayadeep Doryce Mcgregory, MD  12/12/2015 8:55 AM    [facility] [address], [place], [place]  [address] Phone: [phone]; Fax: [phone]

## 2015-12-12 ENCOUNTER — Ambulatory Visit (INDEPENDENT_AMBULATORY_CARE_PROVIDER_SITE_OTHER): Payer: BLUE CROSS/BLUE SHIELD | Admitting: Interventional Cardiology

## 2015-12-12 ENCOUNTER — Encounter: Payer: Self-pay | Admitting: Interventional Cardiology

## 2015-12-12 VITALS — BP 130/70 | HR 93 | Ht 67.0 in | Wt 202.8 lb

## 2015-12-12 DIAGNOSIS — I10 Essential (primary) hypertension: Secondary | ICD-10-CM

## 2015-12-12 DIAGNOSIS — H349 Unspecified retinal vascular occlusion: Secondary | ICD-10-CM | POA: Diagnosis not present

## 2015-12-12 DIAGNOSIS — I480 Paroxysmal atrial fibrillation: Secondary | ICD-10-CM

## 2015-12-12 DIAGNOSIS — I4892 Unspecified atrial flutter: Secondary | ICD-10-CM

## 2015-12-12 MED ORDER — BENAZEPRIL-HYDROCHLOROTHIAZIDE 20-25 MG PO TABS: ORAL_TABLET | ORAL | Status: AC

## 2015-12-12 NOTE — Patient Instructions (Signed)
Medication Instructions:  Decrease your Benazepril/HCTZ to 1/2 tablet daily. All other medications remain the same.  Labwork: None  Testing/Procedures: None  Follow-Up: Your physician wants you to follow-up in: 1 year. You will receive a reminder letter in the mail two months in advance. If you don't receive a letter, please call our office to schedule the follow-up appointment.     If you need a refill on your cardiac medications before your next appointment, please call your pharmacy.

## 2016-03-15 ENCOUNTER — Other Ambulatory Visit: Payer: Self-pay | Admitting: Interventional Cardiology

## 2016-03-16 ENCOUNTER — Telehealth: Payer: Self-pay | Admitting: *Deleted

## 2016-03-18 NOTE — Telephone Encounter (Signed)
What kind of monitoring? QT interval or electrolytes?

## 2016-03-18 NOTE — Telephone Encounter (Signed)
Have spoken with Landis MartinsKelly Auten, [facility] who states that it is ok for the pt to take both Wellbutrin and Flecainide as long as there is monitoring. Will forward to Dr Eldridge DaceVaranasi for his recommendation.  Please advise.

## 2016-03-18 NOTE — Telephone Encounter (Signed)
Awilda BillBrandy J Carden, CMA  You 2 days ago    Drug-drug between flec & wellbutrin, please call [facility] and verify if they can take both, thanks  (Routing comment)

## 2016-03-19 NOTE — Telephone Encounter (Signed)
Would recommend f/u EKG to monitor QTc. QTc in November 2017 was fine and pt has been on flecainide for at least a few years. If pt has been on Wellbutrin prior to EKG in November 2017, likely would not need follow up. If it has been newly prescribed since then, would bring pt in within the next week for an EKG.

## 2016-03-20 NOTE — Telephone Encounter (Signed)
**Note De-Identified Tiffanye Hartmann Obfuscation** I left a detailed message on the pts VM explaining Dr Hoyle BarrVaranasi's recommendation and I left this office's phone number for him to call me back if his Wellbutrin dose has changed since his last OV with Dr Eldridge DaceVaranasi and so he can call back if he has any questions or concerns.  Also, I have [facility] to make [facility] aware as well.

## 2016-03-20 NOTE — Telephone Encounter (Signed)
Has he started the Wellbutrin.  Once he has started, would check ECG next week to assess QT interval.

## 2016-03-20 NOTE — Telephone Encounter (Signed)
**Note De-Identified Micheal Davila Obfuscation** The pt states that he has been taking Wellbutrin for 2 years and was taking it at his last OV with Dr Eldridge DaceVaranasi on 12/12/15. He states that as far as he knows his PCP is not monitoring him. He wants to know if Dr Eldridge DaceVaranasi can look at his EKG from his last OV with him and determine if it is ok or will he need to come in for EKG?   Please advise.

## 2016-03-20 NOTE — Telephone Encounter (Signed)
LMTCB

## 2016-03-20 NOTE — Telephone Encounter (Signed)
QT interval normal at last check.  No need for another ECG if there has not been any change in Welbutrin dose.

## 2016-07-01 DIAGNOSIS — M9901 Segmental and somatic dysfunction of cervical region: Secondary | ICD-10-CM | POA: Diagnosis not present

## 2016-07-01 DIAGNOSIS — M9903 Segmental and somatic dysfunction of lumbar region: Secondary | ICD-10-CM | POA: Diagnosis not present

## 2016-07-01 DIAGNOSIS — M5431 Sciatica, right side: Secondary | ICD-10-CM | POA: Diagnosis not present

## 2016-07-01 DIAGNOSIS — S6991XA Unspecified injury of right wrist, hand and finger(s), initial encounter: Secondary | ICD-10-CM | POA: Diagnosis not present

## 2016-07-01 DIAGNOSIS — M9902 Segmental and somatic dysfunction of thoracic region: Secondary | ICD-10-CM | POA: Diagnosis not present

## 2016-07-03 DIAGNOSIS — M9903 Segmental and somatic dysfunction of lumbar region: Secondary | ICD-10-CM | POA: Diagnosis not present

## 2016-07-03 DIAGNOSIS — M9902 Segmental and somatic dysfunction of thoracic region: Secondary | ICD-10-CM | POA: Diagnosis not present

## 2016-07-03 DIAGNOSIS — M9901 Segmental and somatic dysfunction of cervical region: Secondary | ICD-10-CM | POA: Diagnosis not present

## 2016-07-03 DIAGNOSIS — M5431 Sciatica, right side: Secondary | ICD-10-CM | POA: Diagnosis not present

## 2016-07-06 DIAGNOSIS — M5431 Sciatica, right side: Secondary | ICD-10-CM | POA: Diagnosis not present

## 2016-07-06 DIAGNOSIS — M9902 Segmental and somatic dysfunction of thoracic region: Secondary | ICD-10-CM | POA: Diagnosis not present

## 2016-07-06 DIAGNOSIS — M9903 Segmental and somatic dysfunction of lumbar region: Secondary | ICD-10-CM | POA: Diagnosis not present

## 2016-07-06 DIAGNOSIS — M9901 Segmental and somatic dysfunction of cervical region: Secondary | ICD-10-CM | POA: Diagnosis not present

## 2016-07-10 DIAGNOSIS — M9902 Segmental and somatic dysfunction of thoracic region: Secondary | ICD-10-CM | POA: Diagnosis not present

## 2016-07-10 DIAGNOSIS — M9901 Segmental and somatic dysfunction of cervical region: Secondary | ICD-10-CM | POA: Diagnosis not present

## 2016-07-10 DIAGNOSIS — M9903 Segmental and somatic dysfunction of lumbar region: Secondary | ICD-10-CM | POA: Diagnosis not present

## 2016-07-10 DIAGNOSIS — M5431 Sciatica, right side: Secondary | ICD-10-CM | POA: Diagnosis not present

## 2016-07-13 DIAGNOSIS — M5431 Sciatica, right side: Secondary | ICD-10-CM | POA: Diagnosis not present

## 2016-07-13 DIAGNOSIS — M9902 Segmental and somatic dysfunction of thoracic region: Secondary | ICD-10-CM | POA: Diagnosis not present

## 2016-07-13 DIAGNOSIS — M9903 Segmental and somatic dysfunction of lumbar region: Secondary | ICD-10-CM | POA: Diagnosis not present

## 2016-07-13 DIAGNOSIS — M9901 Segmental and somatic dysfunction of cervical region: Secondary | ICD-10-CM | POA: Diagnosis not present

## 2016-07-17 DIAGNOSIS — M9901 Segmental and somatic dysfunction of cervical region: Secondary | ICD-10-CM | POA: Diagnosis not present

## 2016-07-17 DIAGNOSIS — M9902 Segmental and somatic dysfunction of thoracic region: Secondary | ICD-10-CM | POA: Diagnosis not present

## 2016-07-17 DIAGNOSIS — M9903 Segmental and somatic dysfunction of lumbar region: Secondary | ICD-10-CM | POA: Diagnosis not present

## 2016-07-17 DIAGNOSIS — M5431 Sciatica, right side: Secondary | ICD-10-CM | POA: Diagnosis not present

## 2016-07-22 DIAGNOSIS — S638X1A Sprain of other part of right wrist and hand, initial encounter: Secondary | ICD-10-CM | POA: Diagnosis not present

## 2016-08-13 DIAGNOSIS — M9903 Segmental and somatic dysfunction of lumbar region: Secondary | ICD-10-CM | POA: Diagnosis not present

## 2016-08-13 DIAGNOSIS — M9902 Segmental and somatic dysfunction of thoracic region: Secondary | ICD-10-CM | POA: Diagnosis not present

## 2016-08-13 DIAGNOSIS — M5431 Sciatica, right side: Secondary | ICD-10-CM | POA: Diagnosis not present

## 2016-08-13 DIAGNOSIS — M9901 Segmental and somatic dysfunction of cervical region: Secondary | ICD-10-CM | POA: Diagnosis not present

## 2016-08-18 ENCOUNTER — Other Ambulatory Visit: Payer: Self-pay | Admitting: Interventional Cardiology

## 2016-08-18 MED ORDER — FLECAINIDE ACETATE 100 MG PO TABS: 100.0000 mg | ORAL_TABLET | Freq: Two times a day (BID) | ORAL | 0 refills | Status: DC

## 2016-08-18 NOTE — Telephone Encounter (Signed)
Medication Detail    Disp Refills Start End   flecainide (TAMBOCOR) 100 MG tablet 180 tablet 2 03/16/2016    Sig - Route: TAKE 1 TABLET (100 MG TOTAL) BY MOUTH 2 (TWO) TIMES DAILY. - Oral   Sent to pharmacy as: flecainide (TAMBOCOR) 100 MG tablet   E-Prescribing Status: Receipt confirmed by pharmacy (03/16/2016 2:53 PM EST)   [facility]   [facility] [address] - [place], [place] [phone]

## 2016-08-18 NOTE — Telephone Encounter (Signed)
Pt's medication was sent to pt's pharmacy as requested. Confirmation received.  °

## 2016-10-06 DIAGNOSIS — F339 Major depressive disorder, recurrent, unspecified: Secondary | ICD-10-CM | POA: Diagnosis not present

## 2016-10-06 DIAGNOSIS — N529 Male erectile dysfunction, unspecified: Secondary | ICD-10-CM | POA: Diagnosis not present

## 2016-10-06 DIAGNOSIS — I10 Essential (primary) hypertension: Secondary | ICD-10-CM | POA: Diagnosis not present

## 2016-10-06 DIAGNOSIS — I480 Paroxysmal atrial fibrillation: Secondary | ICD-10-CM | POA: Diagnosis not present

## 2016-10-06 DIAGNOSIS — Z0000 Encounter for general adult medical examination without abnormal findings: Secondary | ICD-10-CM | POA: Diagnosis not present

## 2016-10-06 DIAGNOSIS — Z125 Encounter for screening for malignant neoplasm of prostate: Secondary | ICD-10-CM | POA: Diagnosis not present

## 2016-10-22 DIAGNOSIS — Z1211 Encounter for screening for malignant neoplasm of colon: Secondary | ICD-10-CM | POA: Diagnosis not present

## 2016-10-22 DIAGNOSIS — Z01818 Encounter for other preprocedural examination: Secondary | ICD-10-CM | POA: Diagnosis not present

## 2016-12-11 ENCOUNTER — Encounter: Payer: Self-pay | Admitting: Interventional Cardiology

## 2016-12-11 ENCOUNTER — Encounter (INDEPENDENT_AMBULATORY_CARE_PROVIDER_SITE_OTHER): Payer: Self-pay

## 2016-12-11 ENCOUNTER — Ambulatory Visit: Payer: BLUE CROSS/BLUE SHIELD | Admitting: Interventional Cardiology

## 2016-12-11 VITALS — BP 128/62 | HR 76 | Ht 67.0 in | Wt 206.8 lb

## 2016-12-11 DIAGNOSIS — H349 Unspecified retinal vascular occlusion: Secondary | ICD-10-CM | POA: Diagnosis not present

## 2016-12-11 DIAGNOSIS — I10 Essential (primary) hypertension: Secondary | ICD-10-CM

## 2016-12-11 DIAGNOSIS — I4892 Unspecified atrial flutter: Secondary | ICD-10-CM

## 2016-12-11 DIAGNOSIS — I480 Paroxysmal atrial fibrillation: Secondary | ICD-10-CM | POA: Diagnosis not present

## 2016-12-11 MED ORDER — FLECAINIDE ACETATE 100 MG PO TABS: 100.0000 mg | ORAL_TABLET | Freq: Two times a day (BID) | ORAL | 3 refills | Status: DC

## 2016-12-11 NOTE — Patient Instructions (Signed)

## 2016-12-11 NOTE — Progress Notes (Signed)
Cardiology Office Note   Date:  12/11/2016   ID:  Micheal MechanicRichard A Prigmore, DOB 02/09/1955, MRN [id number]  PCP:  Clayborn Heronankins, Victoria R, MD    No chief complaint on file.  AFib  Wt Readings from Last 3 Encounters:  12/11/16 206 lb 12.8 oz (93.8 kg)  12/12/15 202 lb 12.8 oz (92 kg)  11/26/14 188 lb (85.3 kg)       History of Present Illness: Micheal Davila is a 61 y.o. male  who had atrial fluttter/afib starting many years ago. He had palpitations at the time. He was seen in the ER in 2009 and saw a cardiologist. He wore a monitor and was found to have atrial flutter and had an ablation, in [place]. He started on flecainide for AFib. No further sx since being on flecainide. Never took COumadin. He does have HTN since 2011. No diabetes. Many months post ablation, he had an embolus to the left eye. It ws not thought to be related to the ablation per his report. Carotid Doppler did not show any significant stenosis. Negative stress echo in 2009.   He has done well since the last visit.  Denies : Chest pain. Dizziness. Leg edema. Nitroglycerin use. Orthopnea. Palpitations. Paroxysmal nocturnal dyspnea. Shortness of breath. Syncope.   No problems with the medicines.  DEcreased BP meds due to low readings.      Past Medical History:  Diagnosis Date  . ADHD (attention deficit hyperactivity disorder)    full eval as an adult though [facility] (not stimulant candidate but can try stratttera)  . Atrial fibrillation (HCC)    PAF/aflutter 2000; s/p ablation has been in NSR  . Dizziness and giddiness   . ED (erectile dysfunction)   . GERD (gastroesophageal reflux disease)   . Hyperlipidemia   . Sleep apnea    was on cpap but never used after ENT surgery. He never had post-surg f/u  . Strain of right hip adductor muscle   . Unspecified essential hypertension     History reviewed. No pertinent surgical history.   Current Outpatient Medications  Medication Sig Dispense  Refill  . aspirin 81 MG tablet Take 81 mg by mouth daily.    . benazepril-hydrochlorthiazide (LOTENSIN HCT) 20-25 MG tablet Take 1/2 tablet daily    . buPROPion (WELLBUTRIN XL) 150 MG 24 hr tablet Take 150 mg by mouth daily.    . flecainide (TAMBOCOR) 100 MG tablet Take 1 tablet (100 mg total) by mouth 2 (two) times daily. 180 tablet 0  . Multiple Vitamin (MULTIVITAMIN) tablet Take 1 tablet by mouth daily.     No current facility-administered medications for this visit.     Allergies:   Strattera [atomoxetine hcl]    Social History:  The patient  reports that  has never smoked. he has never used smokeless tobacco. He reports that he drinks alcohol. He reports that he does not use drugs.   Family History:  The patient's family history includes Diabetes in his brother and father; Heart attack in his father; Hypertension in his brother, father, and mother.    ROS:  Please see the history of present illness.   Otherwise, review of systems are positive for low readings on BP, meds decreased.   All other systems are reviewed and negative.    PHYSICAL EXAM: VS:  BP 128/62   Pulse 76   Ht 5\' 7"  (1.702 m)   Wt 206 lb 12.8 oz (93.8 kg)   SpO2  96%   BMI 32.39 kg/m  , BMI Body mass index is 32.39 kg/m. GEN: Well nourished, well developed, in no acute distress  HEENT: normal  Neck: no JVD, carotid bruits, or masses Cardiac: RRR; no murmurs, rubs, or gallops,no edema  Respiratory:  clear to auscultation bilaterally, normal work of breathing GI: soft, nontender, nondistended, + BS MS: no deformity or atrophy  Skin: warm and dry, no rash Neuro:  Strength and sensation are intact Psych: euthymic mood, full affect   EKG:   The ekg ordered today demonstrates NSR, nonspecific ST changes   Recent Labs: No results found for requested labs within last 8760 hours.   Lipid Panel No results found for: CHOL, TRIG, HDL, CHOLHDL, VLDL, LDLCALC, LDLDIRECT   Other studies Reviewed: Additional  studies/ records that were reviewed today with results demonstrating: prior ECG reviewed; labs reviewed. LDL 128 which is below target for him.   ASSESSMENT AND PLAN:  1. AFib: He has been on flecainide for many years without any additional rate slowing drugs.  He is declined stronger anticoagulation in the past.  He does have a history of a retinal artery embolus shortly after his flutter ablation many years ago.  Continue aspirin.  Chads vasc score 1. 2. HTN: Blood pressure well controlled.  Continue current medications. 3. Continue regular exercise.  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  No problems with this.  I think he exceeds usual exercise thresholds.  His joints are holding up well.     Current medicines are reviewed at length with the patient today.  The patient concerns regarding his medicines were addressed.  The following changes have been made:  No change  Labs/ tests ordered today include:  No orders of the defined types were placed in this encounter.   Recommend 150 minutes/week of aerobic exercise Low fat, low carb, high fiber diet recommended  Disposition:   FU in 1 year   Signed, Lance MussJayadeep Kishana Battey, MD  12/11/2016 2:29 PM    [facility] [address], [place], [place]  [address] Phone: [phone]([phone]; Fax: [phone]) [phone]

## 2016-12-18 DIAGNOSIS — Z23 Encounter for immunization: Secondary | ICD-10-CM | POA: Diagnosis not present

## 2017-05-11 DIAGNOSIS — L918 Other hypertrophic disorders of the skin: Secondary | ICD-10-CM | POA: Diagnosis not present

## 2017-06-15 DIAGNOSIS — H1013 Acute atopic conjunctivitis, bilateral: Secondary | ICD-10-CM | POA: Diagnosis not present

## 2017-06-17 DIAGNOSIS — M5431 Sciatica, right side: Secondary | ICD-10-CM | POA: Diagnosis not present

## 2017-06-17 DIAGNOSIS — M9901 Segmental and somatic dysfunction of cervical region: Secondary | ICD-10-CM | POA: Diagnosis not present

## 2017-06-17 DIAGNOSIS — M9902 Segmental and somatic dysfunction of thoracic region: Secondary | ICD-10-CM | POA: Diagnosis not present

## 2017-06-17 DIAGNOSIS — M9903 Segmental and somatic dysfunction of lumbar region: Secondary | ICD-10-CM | POA: Diagnosis not present

## 2017-06-24 DIAGNOSIS — M9903 Segmental and somatic dysfunction of lumbar region: Secondary | ICD-10-CM | POA: Diagnosis not present

## 2017-06-24 DIAGNOSIS — M9902 Segmental and somatic dysfunction of thoracic region: Secondary | ICD-10-CM | POA: Diagnosis not present

## 2017-06-24 DIAGNOSIS — M5431 Sciatica, right side: Secondary | ICD-10-CM | POA: Diagnosis not present

## 2017-06-24 DIAGNOSIS — M9901 Segmental and somatic dysfunction of cervical region: Secondary | ICD-10-CM | POA: Diagnosis not present

## 2017-07-01 DIAGNOSIS — M5431 Sciatica, right side: Secondary | ICD-10-CM | POA: Diagnosis not present

## 2017-07-01 DIAGNOSIS — M9903 Segmental and somatic dysfunction of lumbar region: Secondary | ICD-10-CM | POA: Diagnosis not present

## 2017-07-01 DIAGNOSIS — M9901 Segmental and somatic dysfunction of cervical region: Secondary | ICD-10-CM | POA: Diagnosis not present

## 2017-07-01 DIAGNOSIS — M9902 Segmental and somatic dysfunction of thoracic region: Secondary | ICD-10-CM | POA: Diagnosis not present

## 2017-11-17 DIAGNOSIS — M9903 Segmental and somatic dysfunction of lumbar region: Secondary | ICD-10-CM | POA: Diagnosis not present

## 2017-11-17 DIAGNOSIS — M9901 Segmental and somatic dysfunction of cervical region: Secondary | ICD-10-CM | POA: Diagnosis not present

## 2017-11-17 DIAGNOSIS — M5431 Sciatica, right side: Secondary | ICD-10-CM | POA: Diagnosis not present

## 2017-11-17 DIAGNOSIS — M9902 Segmental and somatic dysfunction of thoracic region: Secondary | ICD-10-CM | POA: Diagnosis not present

## 2017-11-19 DIAGNOSIS — M9902 Segmental and somatic dysfunction of thoracic region: Secondary | ICD-10-CM | POA: Diagnosis not present

## 2017-11-19 DIAGNOSIS — M9903 Segmental and somatic dysfunction of lumbar region: Secondary | ICD-10-CM | POA: Diagnosis not present

## 2017-11-19 DIAGNOSIS — M5431 Sciatica, right side: Secondary | ICD-10-CM | POA: Diagnosis not present

## 2017-11-19 DIAGNOSIS — M9901 Segmental and somatic dysfunction of cervical region: Secondary | ICD-10-CM | POA: Diagnosis not present

## 2017-11-22 DIAGNOSIS — M9901 Segmental and somatic dysfunction of cervical region: Secondary | ICD-10-CM | POA: Diagnosis not present

## 2017-11-22 DIAGNOSIS — M9902 Segmental and somatic dysfunction of thoracic region: Secondary | ICD-10-CM | POA: Diagnosis not present

## 2017-11-22 DIAGNOSIS — M5431 Sciatica, right side: Secondary | ICD-10-CM | POA: Diagnosis not present

## 2017-11-22 DIAGNOSIS — M9903 Segmental and somatic dysfunction of lumbar region: Secondary | ICD-10-CM | POA: Diagnosis not present

## 2017-11-25 DIAGNOSIS — M9902 Segmental and somatic dysfunction of thoracic region: Secondary | ICD-10-CM | POA: Diagnosis not present

## 2017-11-25 DIAGNOSIS — M9903 Segmental and somatic dysfunction of lumbar region: Secondary | ICD-10-CM | POA: Diagnosis not present

## 2017-11-25 DIAGNOSIS — M9901 Segmental and somatic dysfunction of cervical region: Secondary | ICD-10-CM | POA: Diagnosis not present

## 2017-11-25 DIAGNOSIS — M5431 Sciatica, right side: Secondary | ICD-10-CM | POA: Diagnosis not present

## 2017-11-30 DIAGNOSIS — M5431 Sciatica, right side: Secondary | ICD-10-CM | POA: Diagnosis not present

## 2017-11-30 DIAGNOSIS — M9903 Segmental and somatic dysfunction of lumbar region: Secondary | ICD-10-CM | POA: Diagnosis not present

## 2017-11-30 DIAGNOSIS — M9902 Segmental and somatic dysfunction of thoracic region: Secondary | ICD-10-CM | POA: Diagnosis not present

## 2017-11-30 DIAGNOSIS — M9901 Segmental and somatic dysfunction of cervical region: Secondary | ICD-10-CM | POA: Diagnosis not present

## 2017-12-07 DIAGNOSIS — M9903 Segmental and somatic dysfunction of lumbar region: Secondary | ICD-10-CM | POA: Diagnosis not present

## 2017-12-07 DIAGNOSIS — M9902 Segmental and somatic dysfunction of thoracic region: Secondary | ICD-10-CM | POA: Diagnosis not present

## 2017-12-07 DIAGNOSIS — M9901 Segmental and somatic dysfunction of cervical region: Secondary | ICD-10-CM | POA: Diagnosis not present

## 2017-12-07 DIAGNOSIS — M5431 Sciatica, right side: Secondary | ICD-10-CM | POA: Diagnosis not present

## 2017-12-14 DIAGNOSIS — M5431 Sciatica, right side: Secondary | ICD-10-CM | POA: Diagnosis not present

## 2017-12-14 DIAGNOSIS — M9901 Segmental and somatic dysfunction of cervical region: Secondary | ICD-10-CM | POA: Diagnosis not present

## 2017-12-14 DIAGNOSIS — M9903 Segmental and somatic dysfunction of lumbar region: Secondary | ICD-10-CM | POA: Diagnosis not present

## 2017-12-14 DIAGNOSIS — M9902 Segmental and somatic dysfunction of thoracic region: Secondary | ICD-10-CM | POA: Diagnosis not present

## 2017-12-21 DIAGNOSIS — M9903 Segmental and somatic dysfunction of lumbar region: Secondary | ICD-10-CM | POA: Diagnosis not present

## 2017-12-21 DIAGNOSIS — M9902 Segmental and somatic dysfunction of thoracic region: Secondary | ICD-10-CM | POA: Diagnosis not present

## 2017-12-21 DIAGNOSIS — M9901 Segmental and somatic dysfunction of cervical region: Secondary | ICD-10-CM | POA: Diagnosis not present

## 2017-12-21 DIAGNOSIS — M5431 Sciatica, right side: Secondary | ICD-10-CM | POA: Diagnosis not present

## 2017-12-29 ENCOUNTER — Encounter: Payer: Self-pay | Admitting: Interventional Cardiology

## 2018-02-08 ENCOUNTER — Ambulatory Visit: Payer: BLUE CROSS/BLUE SHIELD | Admitting: Interventional Cardiology

## 2018-02-23 ENCOUNTER — Ambulatory Visit: Payer: BLUE CROSS/BLUE SHIELD | Admitting: Interventional Cardiology

## 2018-02-23 ENCOUNTER — Encounter: Payer: Self-pay | Admitting: Interventional Cardiology

## 2018-02-23 VITALS — BP 132/82 | HR 73 | Ht 67.0 in | Wt 212.0 lb

## 2018-02-23 DIAGNOSIS — I480 Paroxysmal atrial fibrillation: Secondary | ICD-10-CM

## 2018-02-23 DIAGNOSIS — I10 Essential (primary) hypertension: Secondary | ICD-10-CM | POA: Diagnosis not present

## 2018-02-23 DIAGNOSIS — H349 Unspecified retinal vascular occlusion: Secondary | ICD-10-CM | POA: Diagnosis not present

## 2018-02-23 DIAGNOSIS — I4892 Unspecified atrial flutter: Secondary | ICD-10-CM

## 2018-02-23 MED ORDER — FLECAINIDE ACETATE 100 MG PO TABS: 100.0000 mg | ORAL_TABLET | Freq: Two times a day (BID) | ORAL | 3 refills | Status: DC

## 2018-02-23 NOTE — Patient Instructions (Signed)

## 2018-02-23 NOTE — Progress Notes (Signed)
Cardiology Office Note   Date:  02/23/2018   ID:  Micheal Davila, DOB 04-Jun-1955, MRN [id number]  PCP:  Clayborn Heronankins, Victoria R, MD    No chief complaint on file.  AFib  Wt Readings from Last 3 Encounters:  02/23/18 212 lb (96.2 kg)  12/11/16 206 lb 12.8 oz (93.8 kg)  12/12/15 202 lb 12.8 oz (92 kg)       History of Present Illness: Micheal Davila is a 63 y.o. male   who had atrial fluttter/afib startingmanyyears ago. He had palpitations at the time. He was seen in the ER in 2009 and saw a cardiologist. He wore a monitor and was found to have atrial flutter and had an ablation, in [place]. He started on flecainide for AFib. No further sx since being on flecainide. Never took COumadin. He does have HTN since 2011. No diabetes. Many months post ablation, he had an embolus to the left eye. It ws not thought to be related to the ablation per his report. Carotid Doppler did not show any significant stenosis. Negative stress echo in 2009.   Brother with RF for CAD.  Mother with valve replacement in the past.  Strong family h/o diabetes.   He continues to exercise regularly.  Diet could improve.  Denies : Chest pain. Dizziness. Leg edema. Nitroglycerin use. Orthopnea. Palpitations. Paroxysmal nocturnal dyspnea. Shortness of breath. Syncope.   No bleeding issues.     Past Medical History:  Diagnosis Date  . ADHD (attention deficit hyperactivity disorder)    full eval as an adult though [facility] (not stimulant candidate but can try stratttera)  . Atrial fibrillation (HCC)    PAF/aflutter 2000; s/p ablation has been in NSR  . Dizziness and giddiness   . ED (erectile dysfunction)   . GERD (gastroesophageal reflux disease)   . Hyperlipidemia   . Sleep apnea    was on cpap but never used after ENT surgery. He never had post-surg f/u  . Strain of right hip adductor muscle   . Unspecified essential hypertension     No past surgical history on  file.   Current Outpatient Medications  Medication Sig Dispense Refill  . aspirin 81 MG tablet Take 81 mg by mouth daily.    . benazepril-hydrochlorthiazide (LOTENSIN HCT) 20-25 MG tablet Take 1/2 tablet daily    . buPROPion (WELLBUTRIN XL) 150 MG 24 hr tablet Take 150 mg by mouth daily.    . flecainide (TAMBOCOR) 100 MG tablet Take 1 tablet (100 mg total) 2 (two) times daily by mouth. 180 tablet 3  . Multiple Vitamin (MULTIVITAMIN) tablet Take 1 tablet by mouth daily.     No current facility-administered medications for this visit.     Allergies:   Strattera [atomoxetine hcl]    Social History:  The patient  reports that he has never smoked. He has never used smokeless tobacco. He reports current alcohol use. He reports that he does not use drugs.   Family History:  The patient's family history includes Diabetes in his brother and father; Heart attack in his father; Hypertension in his brother, father, and mother.    ROS:  Please see the history of present illness.   Otherwise, review of systems are positive for joint pains occasionally.   All other systems are reviewed and negative.    PHYSICAL EXAM: VS:  BP 132/82   Pulse 73   Ht 5\' 7"  (1.702 m)   Wt 212 lb (96.2 kg)  SpO2 98%   BMI 33.20 kg/m  , BMI Body mass index is 33.2 kg/m. GEN: Well nourished, well developed, in no acute distress  HEENT: normal  Neck: no JVD, carotid bruits, or masses Cardiac: RRR; no murmurs, rubs, or gallops,no edema  Respiratory:  clear to auscultation bilaterally, normal work of breathing GI: soft, nontender, nondistended, + BS MS: no deformity or atrophy  Skin: warm and dry, no rash Neuro:  Strength and sensation are intact Psych: euthymic mood, full affect   EKG:   The ekg ordered today demonstrates NSR, no ST changes, normal intervals   Recent Labs: No results found for requested labs within last 8760 hours.   Lipid Panel No results found for: CHOL, TRIG, HDL, CHOLHDL, VLDL,  LDLCALC, LDLDIRECT   Other studies Reviewed: Additional studies/ records that were reviewed today with results demonstrating: labs reviewed: LDL 128 in 2018. .   ASSESSMENT AND PLAN:  1. AFib: He has been on flecainide for years to maintain normal sinus rhythm.  He has not used any additional rate slowing drugs.  He has been on aspirin.  He has declined other anticoagulation.  History of retinal artery embolus shortly after his atrial flutter ablation many years ago. Uses apple watch to check heart rate.  No AFib detected. 2. HTN: The current medical regimen is effective;  continue present plan and medications.  Well controlled when he checks it once a week.  3. Obesity: Heart healthy diet.  Spoke about plant based diet and avoiding processed foods.  He does both cardio and weights-lighter.  HR gets to > 90% of maximum. 4. Has physical scheduled in April 2020.  Will have labs repeated at that time.   Current medicines are reviewed at length with the patient today.  The patient concerns regarding his medicines were addressed.  The following changes have been made:  No change  Labs/ tests ordered today include:  No orders of the defined types were placed in this encounter.   Recommend 150 minutes/week of aerobic exercise Low fat, low carb, high fiber diet recommended  Disposition:   FU in 1 year   Signed, Lance MussJayadeep Jermale Crass, MD  02/23/2018 9:12 AM    [facility] [address], [place], [place]  [address] Phone: [phone]; Fax: [phone]

## 2018-03-09 DIAGNOSIS — I480 Paroxysmal atrial fibrillation: Secondary | ICD-10-CM | POA: Diagnosis not present

## 2018-03-09 DIAGNOSIS — I10 Essential (primary) hypertension: Secondary | ICD-10-CM | POA: Diagnosis not present

## 2018-03-09 DIAGNOSIS — F339 Major depressive disorder, recurrent, unspecified: Secondary | ICD-10-CM | POA: Diagnosis not present

## 2018-03-11 DIAGNOSIS — I10 Essential (primary) hypertension: Secondary | ICD-10-CM | POA: Diagnosis not present

## 2018-06-16 DIAGNOSIS — M9903 Segmental and somatic dysfunction of lumbar region: Secondary | ICD-10-CM | POA: Diagnosis not present

## 2018-06-16 DIAGNOSIS — M9901 Segmental and somatic dysfunction of cervical region: Secondary | ICD-10-CM | POA: Diagnosis not present

## 2018-06-16 DIAGNOSIS — M9902 Segmental and somatic dysfunction of thoracic region: Secondary | ICD-10-CM | POA: Diagnosis not present

## 2018-06-16 DIAGNOSIS — M5431 Sciatica, right side: Secondary | ICD-10-CM | POA: Diagnosis not present

## 2018-06-23 DIAGNOSIS — M9903 Segmental and somatic dysfunction of lumbar region: Secondary | ICD-10-CM | POA: Diagnosis not present

## 2018-06-23 DIAGNOSIS — M9902 Segmental and somatic dysfunction of thoracic region: Secondary | ICD-10-CM | POA: Diagnosis not present

## 2018-06-23 DIAGNOSIS — M9901 Segmental and somatic dysfunction of cervical region: Secondary | ICD-10-CM | POA: Diagnosis not present

## 2018-06-23 DIAGNOSIS — M5431 Sciatica, right side: Secondary | ICD-10-CM | POA: Diagnosis not present

## 2018-08-04 DIAGNOSIS — T22012A Burn of unspecified degree of left forearm, initial encounter: Secondary | ICD-10-CM | POA: Diagnosis not present

## 2018-08-04 DIAGNOSIS — T22512A Corrosion of first degree of left forearm, initial encounter: Secondary | ICD-10-CM | POA: Diagnosis not present

## 2018-09-14 DIAGNOSIS — R509 Fever, unspecified: Secondary | ICD-10-CM | POA: Diagnosis not present

## 2018-09-15 DIAGNOSIS — R509 Fever, unspecified: Secondary | ICD-10-CM | POA: Diagnosis not present

## 2019-01-31 DIAGNOSIS — Z20828 Contact with and (suspected) exposure to other viral communicable diseases: Secondary | ICD-10-CM | POA: Diagnosis not present

## 2019-03-06 ENCOUNTER — Telehealth: Payer: Self-pay | Admitting: Interventional Cardiology

## 2019-03-06 NOTE — Telephone Encounter (Signed)
Patient is calling to inform Dr. Eldridge Dace he has tested positive for Covid.

## 2019-03-06 NOTE — Telephone Encounter (Signed)
Called and spoke to patient. He states that he tested positive for COVID yesterday 03/05/19 at [facility]. He states that he has had a HA, sinus congestion, mild cough, and a low grade fever of 99.0 degrees F. Patient has not been hospitalized and states that he otherwise feels okay. Patient's appointment rescheduled for 2/26. Instructed patient to let us know if his Sx change or worsen before then or if he is running a fever within 72 hours of appt. Patient verbalized understanding and thanked me for the call.

## 2019-03-08 ENCOUNTER — Other Ambulatory Visit: Payer: Self-pay | Admitting: Interventional Cardiology

## 2019-03-10 ENCOUNTER — Ambulatory Visit: Payer: BLUE CROSS/BLUE SHIELD | Admitting: Interventional Cardiology

## 2019-03-30 NOTE — Progress Notes (Signed)
Cardiology Office Note   Date:  03/31/2019   ID:  Micheal Davila, DOB [birth year], MRN [id number]  PCP:  Aretta Nip, MD    No chief complaint on file.  AFib  Wt Readings from Last 3 Encounters:  03/31/19 228 lb 1.9 oz (103.5 kg)  02/23/18 212 lb (96.2 kg)  12/11/16 206 lb 12.8 oz (93.8 kg)       History of Present Illness: Micheal Davila is a 64 y.o. male  who had atrial fluttter/afib startingmanyyears ago. He had palpitations at the time. He was seen in the ER in 2009 and saw a cardiologist. He wore a monitor and was found to have atrial flutter and had an ablation, in [place], [place]. He started on flecainide for AFib. No further sx since being on flecainide. Never took COumadin. He does have HTN since 2011. No diabetes. Many months post ablation, he had an embolus to the left eye. It ws not thought to be related to the ablation per his report. Carotid Doppler did not show any significant stenosis. Negative stress echo in 2009.  Brother with RF for CAD.  Mother with valve replacement in the past.  Strong family h/o diabetes.   He had COVID, tested positive on Mar 05, 2019.  He had h/a, sinus pressure, mild cough, fatigue. 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 He did not exercise for 3 weeks.    Since then, his exercise tolerance has decreased. He resumed exercise a few days ago.  Denies : Chest pain. Dizziness. Leg edema. Nitroglycerin use. Orthopnea. Palpitations. Paroxysmal nocturnal dyspnea. Shortness of breath. Syncope.   No cardiac issues during COVID.     Past Medical History:  Diagnosis Date  . ADHD (attention deficit hyperactivity disorder)    full eval as an adult though [facility] (not stimulant candidate but can try stratttera)  . Atrial fibrillation ([place])    PAF/aflutter 2000; s/p ablation has been in NSR  . Dizziness and giddiness   . ED (erectile dysfunction)   . GERD (gastroesophageal reflux disease)   .  Hyperlipidemia   . Sleep apnea    was on cpap but never used after ENT surgery. He never had post-surg f/u  . Strain of right hip adductor muscle   . Unspecified essential hypertension     No past surgical history on file.   Current Outpatient Medications  Medication Sig Dispense Refill  . aspirin 81 MG tablet Take 81 mg by mouth daily.    . benazepril-hydrochlorthiazide (LOTENSIN HCT) 20-25 MG tablet Take 1/2 tablet daily    . buPROPion (WELLBUTRIN XL) 150 MG 24 hr tablet Take 150 mg by mouth daily.    . flecainide (TAMBOCOR) 100 MG tablet Take 1 tablet (100 mg total) by mouth 2 (two) times daily. Please keep upcoming appt for refills. Thank you 180 tablet 0  . Multiple Vitamin (MULTIVITAMIN) tablet Take 1 tablet by mouth daily.     No current facility-administered medications for this visit.    Allergies:   Strattera [atomoxetine hcl]    Social History:  The patient  reports that he has never smoked. He has never used smokeless tobacco. He reports current alcohol use. He reports that he does not use drugs.   Family History:  The patient's family history includes Diabetes in his brother and father; Heart attack in his father; Hypertension in his brother, father, and mother.    ROS:  Please  see the history of present illness.   Otherwise, review of systems are positive for recent COVID- headache resolved.   All other systems are reviewed and negative.    PHYSICAL EXAM: VS:  BP 138/80   Pulse 84   Ht 5\' 7"  (1.702 m)   Wt 228 lb 1.9 oz (103.5 kg)   SpO2 98%   BMI 35.73 kg/m  , BMI Body mass index is 35.73 kg/m. GEN: Well nourished, well developed, in no acute distress  HEENT: normal  Neck: no JVD, carotid bruits, or masses Cardiac: RRR; no murmurs, rubs, or gallops,no edema  Respiratory:  clear to auscultation bilaterally, normal work of breathing GI: soft, nontender, nondistended, + BS MS: no deformity or atrophy  Skin: warm and dry, no rash Neuro:  Strength and  sensation are intact Psych: euthymic mood, full affect   EKG:   The ekg ordered today demonstrates NSR, no ST changes   Recent Labs: No results found for requested labs within last 8760 hours.   Lipid Panel No results found for: CHOL, TRIG, HDL, CHOLHDL, VLDL, LDLCALC, LDLDIRECT   Other studies Reviewed: Additional studies/ records that were reviewed today with results demonstrating: .   ASSESSMENT AND PLAN:  1. AFib: He has been on flecainide to maintain sinus rhythm for many years.  He has not been on other rate slowing drugs.  Continue aspirin.  We have discussed anticoagulation and he is declined.  He did have a retinal artery embolus after his atrial flutter ablation many years ago.  He is diligent about checking his heart rate with his apple watch.  No atrial fibrillation detected by Apple watch. 2. HTN: The current medical regimen is effective;  continue present plan and medications.  Boderline today ut should improve with increased exercise. 3. Obesity: Whole food, plant-based diet recommended.  Avoid processed foods.  Continue regular exercise. 4. Diet got worse with gym closure and COVID infection.  He is getting back to a healthy diet.  Lost 6 lbs of late. Gets labs with PMD. Physical scheduled for March 2021.  5. With COVID, he will let April 2021 know if he has any change in exercise tolerance or it does not improve. Would consider echo if there was an issue.     Current medicines are reviewed at length with the patient today.  The patient concerns regarding his medicines were addressed.  The following changes have been made:  No change  Labs/ tests ordered today include:  No orders of the defined types were placed in this encounter.   Recommend 150 minutes/week of aerobic exercise Low fat, low carb, high fiber diet recommended  Disposition:   FU in 1 year   Signed, [place], MD  03/31/2019 11:55 AM    [facility] [address] [place],  [place], [place]  [place] Phone: [phone]; Fax: [phone]

## 2019-03-31 ENCOUNTER — Encounter: Payer: Self-pay | Admitting: Interventional Cardiology

## 2019-03-31 ENCOUNTER — Ambulatory Visit (INDEPENDENT_AMBULATORY_CARE_PROVIDER_SITE_OTHER): Payer: 59 | Admitting: Interventional Cardiology

## 2019-03-31 ENCOUNTER — Other Ambulatory Visit: Payer: Self-pay

## 2019-03-31 VITALS — BP 138/80 | HR 84 | Ht 67.0 in | Wt 228.1 lb

## 2019-03-31 DIAGNOSIS — I480 Paroxysmal atrial fibrillation: Secondary | ICD-10-CM

## 2019-03-31 DIAGNOSIS — H349 Unspecified retinal vascular occlusion: Secondary | ICD-10-CM | POA: Diagnosis not present

## 2019-03-31 DIAGNOSIS — I10 Essential (primary) hypertension: Secondary | ICD-10-CM | POA: Diagnosis not present

## 2019-03-31 NOTE — Patient Instructions (Signed)

## 2019-05-05 ENCOUNTER — Other Ambulatory Visit: Payer: 59

## 2019-08-24 ENCOUNTER — Other Ambulatory Visit: Payer: Self-pay | Admitting: Interventional Cardiology

## 2019-12-13 DIAGNOSIS — I10 Essential (primary) hypertension: Secondary | ICD-10-CM | POA: Diagnosis not present

## 2019-12-13 DIAGNOSIS — I480 Paroxysmal atrial fibrillation: Secondary | ICD-10-CM | POA: Diagnosis not present

## 2019-12-13 DIAGNOSIS — F339 Major depressive disorder, recurrent, unspecified: Secondary | ICD-10-CM | POA: Diagnosis not present

## 2020-03-27 NOTE — Progress Notes (Signed)
Cardiology Office Note   Date:  03/28/2020   ID:  Micheal, Davila [birth year], MRN [id number]  PCP:  Clayborn Heron, MD    No chief complaint on file.  AFib  Wt Readings from Last 3 Encounters:  03/28/20 230 lb 12.8 oz (104.7 kg)  03/31/19 228 lb 1.9 oz (103.5 kg)  02/23/18 212 lb (96.2 kg)       History of Present Illness: Micheal Davila is a 65 y.o. male  who had atrial fluttter/afib startingmanyyears ago. He had palpitations at the time. He was seen in the ER in 2009 and saw a cardiologist. He wore a monitor and was found to have atrial flutter and had an ablation, in [place], [place]. He started on flecainide for AFib. No further sx since being on flecainide. Never took COumadin. He does have HTN since 2011. No diabetes. Many months post ablation, he had an embolus to the left eye. It ws not thought to be related to the ablation per his report. Carotid Doppler did not show any significant stenosis. Negative stress echo in 2009.  Brother with RF for CAD. Mother with valve replacement in the past. Strong family h/o diabetes.   He had COVID, tested positive on Mar 05, 2019.  He had h/a, sinus pressure, mild cough, fatigue. 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 He did not exercise for 3 weeks.    After that, his exercise tolerance decreased. He resumed exercise a three weeks after positive test.  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    Walks a lot at [employer], because he walks and lifts heavy things.    Denies : Chest pain. Dizziness. Leg edema. Nitroglycerin use. Orthopnea. Paroxysmal nocturnal dyspnea. Shortness of breath. Syncope.   Pain in Achilles.   Had some palpitations for a week in Jan.  Apple watch noted arrhythmia but it went away on its own.   He has gotten all of his COVID vaccines.       Past Medical History:  Diagnosis Date  . ADHD (attention  deficit hyperactivity disorder)    full eval as an adult though [facility] (not stimulant candidate but can try stratttera)  . Atrial fibrillation (HCC)    PAF/aflutter 2000; s/p ablation has been in NSR  . Dizziness and giddiness   . ED (erectile dysfunction)   . GERD (gastroesophageal reflux disease)   . Hyperlipidemia   . Sleep apnea    was on cpap but never used after ENT surgery. He never had post-surg f/u  . Strain of right hip adductor muscle   . Unspecified essential hypertension     No past surgical history on file.    Allergies:   Strattera [atomoxetine hcl]    Social History:  The patient  reports that he has never smoked. He has never used smokeless tobacco. He reports current alcohol use. He reports that he does not use drugs.   Family History:  The patient's family history includes Diabetes in his brother and father; Heart attack in his father; Hypertension in his brother, father, and mother.    ROS:  Please see the history of present illness.   Otherwise, review of systems are positive for Achilles pain.   All other systems are reviewed and negative.    PHYSICAL EXAM: VS:  BP (!) 142/78   Pulse 79  Ht 5\' 7"  (1.702 m)   Wt 230 lb 12.8 oz (104.7 kg)   SpO2 95%   BMI 36.15 kg/m  , BMI Body mass index is 36.15 kg/m. GEN: Well nourished, well developed, in no acute distress  HEENT: normal  Neck: no JVD, carotid bruits, or masses Cardiac: RRR;  no murmurs, rubs, or gallops,; Respiratory:  clear to auscultation bilaterally, normal work of breathing GI: soft, nontender, nondistended, + BS MS: no deformity or atrophy  Skin: warm and dry, no rash Neuro:  Strength and sensation are intact Psych: euthymic mood, full affect   EKG:   The ekg ordered today demonstrates NSR, nonspecific ST changes   Recent Labs: No results found for requested labs within last 8760 hours.   Lipid Panel No results found for: CHOL, TRIG, HDL, CHOLHDL, VLDL, LDLCALC,  LDLDIRECT   Other studies Reviewed: Additional studies/ records that were reviewed today with results demonstrating: labs reviewed.   ASSESSMENT AND PLAN:  1. AFib: He has been on flecainide for many years to maintain sinus rhythm.  We have not used other rate slowing drugs.  He has also declined anticoagulation.  He follows his heart rhythm with his apple watch diligently.  He is willing to take aspirin.  Check watch for tracings, and snap more tracings to send to .   2. HTN: Low-salt diet.  Avoid processed foods.  He thinks his diet is worse wen depression is worse.  Continue current medications, Lotensin 3. Obesity: Continue regular exercise.  Whole food, plant-based diet recommended.  Had some depression after job change. 4. Post Covid: Exercise improved after several montths.  If there is any change in exercise, let us know.   5. Hyperlipidemia: LDL 142 in 4/21.  LDL 116 in 2015. We discussed statin therapy.   Labs will be rechecked in April.  We discussed calcium scoring CT.  When finances allow, he would like to do this.  This would be helpful to determine whether or not statin therapy would be indicated.  For now, he is increasing exercise and trying to eat healthy.    Current medicines are reviewed at length with the patient today.  The patient concerns regarding his medicines were addressed.  The following changes have been made:  No change  Labs/ tests ordered today include:  No orders of the defined types were placed in this encounter.   Recommend 150 minutes/week of aerobic exercise Low fat, low carb, high fiber diet recommended  Disposition:   FU in 1 year   Signed, [place], MD  03/28/2020 8:43 AM    [facility] [address] [place], [place], [place]  [place] Phone: [phone]; Fax: [phone]

## 2020-03-28 ENCOUNTER — Encounter: Payer: Self-pay | Admitting: Interventional Cardiology

## 2020-03-28 ENCOUNTER — Ambulatory Visit (INDEPENDENT_AMBULATORY_CARE_PROVIDER_SITE_OTHER): Payer: BC Managed Care – PPO | Admitting: Interventional Cardiology

## 2020-03-28 ENCOUNTER — Other Ambulatory Visit: Payer: Self-pay

## 2020-03-28 VITALS — BP 142/78 | HR 79 | Ht 67.0 in | Wt 230.8 lb

## 2020-03-28 DIAGNOSIS — I480 Paroxysmal atrial fibrillation: Secondary | ICD-10-CM

## 2020-03-28 DIAGNOSIS — H349 Unspecified retinal vascular occlusion: Secondary | ICD-10-CM | POA: Diagnosis not present

## 2020-03-28 DIAGNOSIS — I10 Essential (primary) hypertension: Secondary | ICD-10-CM

## 2020-03-28 NOTE — Patient Instructions (Signed)
Medication Instructions:  Your physician recommends that you continue on your current medications as directed. Please refer to the Current Medication list given to you today.  *If you need a refill on your cardiac medications before your next appointment, please call your pharmacy*   Lab Work: none If you have labs (blood work) drawn today and your tests are completely normal, you will receive your results only by: Marland Kitchen MyChart Message (if you have MyChart) OR . A paper copy in the mail If you have any lab test that is abnormal or we need to change your treatment, we will call you to review the results.   Testing/Procedures: none   Follow-Up: At [facility], you and your health needs are our priority.  As part of our continuing mission to provide you with exceptional heart care, we have created designated Provider Care Teams.  These Care Teams include your primary Cardiologist (physician) and Advanced Practice Providers (APPs -  Physician Assistants and Nurse Practitioners) who all work together to provide you with the care you need, when you need it.  We recommend signing up for the patient portal called "MyChart".  Sign up information is provided on this After Visit Summary.  MyChart is used to connect with patients for Virtual Visits (Telemedicine).  Patients are able to view lab/test results, encounter notes, upcoming appointments, etc.  Non-urgent messages can be sent to your provider as well.   To learn more about what you can do with MyChart, go to [email or web address].    Your next appointment:   12 month(s)  The format for your next appointment:   In Person  Provider:   You may see Lance Muss, MD or one of the following Advanced Practice Providers on your designated Care Team:    Ronie Spies, PA-C  Jacolyn Reedy, PA-C    Other Instructions  Low-Sodium Eating Plan Sodium, which is an element that makes up salt, helps you maintain a healthy balance of fluids  in your body. Too much sodium can increase your blood pressure and cause fluid and waste to be held in your body. Your health care provider or dietitian may recommend following this plan if you have high blood pressure (hypertension), kidney disease, liver disease, or heart failure. Eating less sodium can help lower your blood pressure, reduce swelling, and protect your heart, liver, and kidneys. What are tips for following this plan? Reading food labels  The Nutrition Facts label lists the amount of sodium in one serving of the food. If you eat more than one serving, you must multiply the listed amount of sodium by the number of servings.  Choose foods with less than 140 mg of sodium per serving.  Avoid foods with 300 mg of sodium or more per serving. Shopping  Look for lower-sodium products, often labeled as "low-sodium" or "no salt added."  Always check the sodium content, even if foods are labeled as "unsalted" or "no salt added."  Buy fresh foods. ? Avoid canned foods and pre-made or frozen meals. ? Avoid canned, cured, or processed meats.  Buy breads that have less than 80 mg of sodium per slice.   Cooking  Eat more home-cooked food and less restaurant, buffet, and fast food.  Avoid adding salt when cooking. Use salt-free seasonings or herbs instead of table salt or sea salt. Check with your health care provider or pharmacist before using salt substitutes.  Cook with plant-based oils, such as canola, sunflower, or olive oil.   Meal planning  When eating at a restaurant, ask that your food be prepared with less salt or no salt, if possible. Avoid dishes labeled as brined, pickled, cured, smoked, or made with soy sauce, miso, or teriyaki sauce.  Avoid foods that contain MSG (monosodium glutamate). MSG is sometimes added to [place] food, bouillon, and some canned foods.  Make meals that can be grilled, baked, poached, roasted, or steamed. These are generally made with less  sodium. General information Most people on this plan should limit their sodium intake to 1,500-2,000 mg (milligrams) of sodium each day. What foods should I eat? Fruits Fresh, frozen, or canned fruit. Fruit juice. Vegetables Fresh or frozen vegetables. "No salt added" canned vegetables. "No salt added" tomato sauce and paste. Low-sodium or reduced-sodium tomato and vegetable juice. Grains Low-sodium cereals, including oats, puffed wheat and rice, and shredded wheat. Low-sodium crackers. Unsalted rice. Unsalted pasta. Low-sodium bread. Whole-grain breads and whole-grain pasta. Meats and other proteins Fresh or frozen (no salt added) meat, poultry, seafood, and fish. Low-sodium canned tuna and salmon. Unsalted nuts. Dried peas, beans, and lentils without added salt. Unsalted canned beans. Eggs. Unsalted nut butters. Dairy Milk. Soy milk. Cheese that is naturally low in sodium, such as ricotta cheese, fresh mozzarella, or Swiss cheese. Low-sodium or reduced-sodium cheese. Cream cheese. Yogurt. Seasonings and condiments Fresh and dried herbs and spices. Salt-free seasonings. Low-sodium mustard and ketchup. Sodium-free salad dressing. Sodium-free light mayonnaise. Fresh or refrigerated horseradish. Lemon juice. Vinegar. Other foods Homemade, reduced-sodium, or low-sodium soups. Unsalted popcorn and pretzels. Low-salt or salt-free chips. The items listed above may not be a complete list of foods and beverages you can eat. Contact a dietitian for more information. What foods should I avoid? Vegetables Sauerkraut, pickled vegetables, and relishes. Olives. [place] fries. Onion rings. Regular canned vegetables (not low-sodium or reduced-sodium). Regular canned tomato sauce and paste (not low-sodium or reduced-sodium). Regular tomato and vegetable juice (not low-sodium or reduced-sodium). Frozen vegetables in sauces. Grains Instant hot cereals. Bread stuffing, pancake, and biscuit mixes. Croutons.  Seasoned rice or pasta mixes. Noodle soup cups. Boxed or frozen macaroni and cheese. Regular salted crackers. Self-rising flour. Meats and other proteins Meat or fish that is salted, canned, smoked, spiced, or pickled. Precooked or cured meat, such as sausages or meat loaves. Berniece Salines. Ham. Pepperoni. Hot dogs. Corned beef. Chipped beef. Salt pork. Jerky. Pickled herring. Anchovies and sardines. Regular canned tuna. Salted nuts. Dairy Processed cheese and cheese spreads. Hard cheeses. Cheese curds. Blue cheese. Feta cheese. String cheese. Regular cottage cheese. Buttermilk. Canned milk. Fats and oils Salted butter. Regular margarine. Ghee. Bacon fat. Seasonings and condiments Onion salt, garlic salt, seasoned salt, table salt, and sea salt. Canned and packaged gravies. Worcestershire sauce. Tartar sauce. Barbecue sauce. Teriyaki sauce. Soy sauce, including reduced-sodium. Steak sauce. Fish sauce. Oyster sauce. Cocktail sauce. Horseradish that you find on the shelf. Regular ketchup and mustard. Meat flavorings and tenderizers. Bouillon cubes. Hot sauce. Pre-made or packaged marinades. Pre-made or packaged taco seasonings. Relishes. Regular salad dressings. Salsa. Other foods Salted popcorn and pretzels. Corn chips and puffs. Potato and tortilla chips. Canned or dried soups. Pizza. Frozen entrees and pot pies. The items listed above may not be a complete list of foods and beverages you should avoid. Contact a dietitian for more information. Summary  Eating less sodium can help lower your blood pressure, reduce swelling, and protect your heart, liver, and kidneys.  Most people on this plan should limit their sodium intake to 1,500-2,000 mg (milligrams) of sodium each  day.  Canned, boxed, and frozen foods are high in sodium. Restaurant foods, fast foods, and pizza are also very high in sodium. You also get sodium by adding salt to food.  Try to cook at home, eat more fresh fruits and vegetables, and eat  less fast food and canned, processed, or prepared foods. This information is not intended to replace advice given to you by your health care provider. Make sure you discuss any questions you have with your health care provider. Document Revised: 02/24/2019 Document Reviewed: 12/21/2018 Elsevier Patient Education  2021 Elsevier Inc.   High-Fiber Eating Plan Fiber, also called dietary fiber, is a type of carbohydrate. It is found foods such as fruits, vegetables, whole grains, and beans. A high-fiber diet can have many health benefits. Your health care provider may recommend a high-fiber diet to help:  Prevent constipation. Fiber can make your bowel movements more regular.  Lower your cholesterol.  Relieve the following conditions: ? Inflammation of veins in the anus (hemorrhoids). ? Inflammation of specific areas of the digestive tract (uncomplicated diverticulosis). ? A problem of the large intestine, also called the colon, that sometimes causes pain and diarrhea (irritable bowel syndrome, or IBS).  Prevent overeating as part of a weight-loss plan.  Prevent heart disease, type 2 diabetes, and certain cancers. What are tips for following this plan? Reading food labels  Check the nutrition facts label on food products for the amount of dietary fiber. Choose foods that have 5 grams of fiber or more per serving.  The goals for recommended daily fiber intake include: ? Men (age 67 or younger): 34-38 g. ? Men (over age 26): 28-34 g. ? Women (age 55 or younger): 25-28 g. ? Women (over age 21): 22-25 g. Your daily fiber goal is _____________ g.   Shopping  Choose whole fruits and vegetables instead of processed forms, such as apple juice or applesauce.  Choose a wide variety of high-fiber foods such as avocados, lentils, oats, and kidney beans.  Read the nutrition facts label of the foods you choose. Be aware of foods with added fiber. These foods often have high sugar and sodium  amounts per serving. Cooking  Use whole-grain flour for baking and cooking.  Cook with brown rice instead of white rice. Meal planning  Start the day with a breakfast that is high in fiber, such as a cereal that contains 5 g of fiber or more per serving.  Eat breads and cereals that are made with whole-grain flour instead of refined flour or white flour.  Eat brown rice, bulgur wheat, or millet instead of white rice.  Use beans in place of meat in soups, salads, and pasta dishes.  Be sure that half of the grains you eat each day are whole grains. General information  You can get the recommended daily intake of dietary fiber by: ? Eating a variety of fruits, vegetables, grains, nuts, and beans. ? Taking a fiber supplement if you are not able to take in enough fiber in your diet. It is better to get fiber through food than from a supplement.  Gradually increase how much fiber you consume. If you increase your intake of dietary fiber too quickly, you may have bloating, cramping, or gas.  Drink plenty of water to help you digest fiber.  Choose high-fiber snacks, such as berries, raw vegetables, nuts, and popcorn. What foods should I eat? Fruits Berries. Pears. Apples. Oranges. Avocado. Prunes and raisins. Dried figs. Vegetables Sweet potatoes. Spinach. Kale. Artichokes.  Cabbage. Broccoli. Cauliflower. Green peas. Carrots. Squash. Grains Whole-grain breads. Multigrain cereal. Oats and oatmeal. Brown rice. Barley. Bulgur wheat. Millet. Quinoa. Bran muffins. Popcorn. Rye wafer crackers. Meats and other proteins Navy beans, kidney beans, and pinto beans. Soybeans. Split peas. Lentils. Nuts and seeds. Dairy Fiber-fortified yogurt. Beverages Fiber-fortified soy milk. Fiber-fortified orange juice. Other foods Fiber bars. The items listed above may not be a complete list of recommended foods and beverages. Contact a dietitian for more information. What foods should I  avoid? Fruits Fruit juice. Cooked, strained fruit. Vegetables Fried potatoes. Canned vegetables. Well-cooked vegetables. Grains White bread. Pasta made with refined flour. White rice. Meats and other proteins Fatty cuts of meat. Fried chicken or fried fish. Dairy Milk. Yogurt. Cream cheese. Sour cream. Fats and oils Butters. Beverages Soft drinks. Other foods Cakes and pastries. The items listed above may not be a complete list of foods and beverages to avoid. Talk with your dietitian about what choices are best for you. Summary  Fiber is a type of carbohydrate. It is found in foods such as fruits, vegetables, whole grains, and beans.  A high-fiber diet has many benefits. It can help to prevent constipation, lower blood cholesterol, aid weight loss, and reduce your risk of heart disease, diabetes, and certain cancers.  Increase your intake of fiber gradually. Increasing fiber too quickly may cause cramping, bloating, and gas. Drink plenty of water while you increase the amount of fiber you consume.  The best sources of fiber include whole fruits and vegetables, whole grains, nuts, seeds, and beans. This information is not intended to replace advice given to you by your health care provider. Make sure you discuss any questions you have with your health care provider. Document Revised: 05/25/2019 Document Reviewed: 05/25/2019 Elsevier Patient Education  2021 [employer].

## 2020-05-15 DIAGNOSIS — I10 Essential (primary) hypertension: Secondary | ICD-10-CM | POA: Diagnosis not present

## 2020-05-15 DIAGNOSIS — F339 Major depressive disorder, recurrent, unspecified: Secondary | ICD-10-CM | POA: Diagnosis not present

## 2020-05-15 DIAGNOSIS — Z0000 Encounter for general adult medical examination without abnormal findings: Secondary | ICD-10-CM | POA: Diagnosis not present

## 2020-05-15 DIAGNOSIS — I480 Paroxysmal atrial fibrillation: Secondary | ICD-10-CM | POA: Diagnosis not present

## 2020-05-15 DIAGNOSIS — Z1211 Encounter for screening for malignant neoplasm of colon: Secondary | ICD-10-CM | POA: Diagnosis not present

## 2020-06-03 DIAGNOSIS — Z125 Encounter for screening for malignant neoplasm of prostate: Secondary | ICD-10-CM | POA: Diagnosis not present

## 2020-06-03 DIAGNOSIS — I10 Essential (primary) hypertension: Secondary | ICD-10-CM | POA: Diagnosis not present

## 2020-08-29 DIAGNOSIS — Z20822 Contact with and (suspected) exposure to covid-19: Secondary | ICD-10-CM | POA: Diagnosis not present

## 2020-08-29 DIAGNOSIS — U071 COVID-19: Secondary | ICD-10-CM | POA: Diagnosis not present

## 2020-09-09 DIAGNOSIS — D485 Neoplasm of uncertain behavior of skin: Secondary | ICD-10-CM | POA: Diagnosis not present

## 2020-09-09 DIAGNOSIS — L989 Disorder of the skin and subcutaneous tissue, unspecified: Secondary | ICD-10-CM | POA: Diagnosis not present

## 2020-09-09 DIAGNOSIS — D225 Melanocytic nevi of trunk: Secondary | ICD-10-CM | POA: Diagnosis not present

## 2020-09-09 DIAGNOSIS — Z1283 Encounter for screening for malignant neoplasm of skin: Secondary | ICD-10-CM | POA: Diagnosis not present

## 2020-09-30 DIAGNOSIS — H6123 Impacted cerumen, bilateral: Secondary | ICD-10-CM | POA: Diagnosis not present

## 2020-09-30 DIAGNOSIS — J329 Chronic sinusitis, unspecified: Secondary | ICD-10-CM | POA: Diagnosis not present

## 2020-10-03 DIAGNOSIS — L988 Other specified disorders of the skin and subcutaneous tissue: Secondary | ICD-10-CM | POA: Diagnosis not present

## 2020-10-03 DIAGNOSIS — D485 Neoplasm of uncertain behavior of skin: Secondary | ICD-10-CM | POA: Diagnosis not present

## 2020-10-06 ENCOUNTER — Other Ambulatory Visit: Payer: Self-pay | Admitting: Interventional Cardiology

## 2020-10-13 DIAGNOSIS — S00411A Abrasion of right ear, initial encounter: Secondary | ICD-10-CM | POA: Diagnosis not present

## 2020-10-13 DIAGNOSIS — H6123 Impacted cerumen, bilateral: Secondary | ICD-10-CM | POA: Diagnosis not present

## 2020-10-13 DIAGNOSIS — Z792 Long term (current) use of antibiotics: Secondary | ICD-10-CM | POA: Diagnosis not present

## 2020-10-23 DIAGNOSIS — M9902 Segmental and somatic dysfunction of thoracic region: Secondary | ICD-10-CM | POA: Diagnosis not present

## 2020-10-23 DIAGNOSIS — M9901 Segmental and somatic dysfunction of cervical region: Secondary | ICD-10-CM | POA: Diagnosis not present

## 2020-10-23 DIAGNOSIS — M9903 Segmental and somatic dysfunction of lumbar region: Secondary | ICD-10-CM | POA: Diagnosis not present

## 2020-10-23 DIAGNOSIS — M5431 Sciatica, right side: Secondary | ICD-10-CM | POA: Diagnosis not present

## 2020-10-30 DIAGNOSIS — M5431 Sciatica, right side: Secondary | ICD-10-CM | POA: Diagnosis not present

## 2020-10-30 DIAGNOSIS — M9901 Segmental and somatic dysfunction of cervical region: Secondary | ICD-10-CM | POA: Diagnosis not present

## 2020-10-30 DIAGNOSIS — M9902 Segmental and somatic dysfunction of thoracic region: Secondary | ICD-10-CM | POA: Diagnosis not present

## 2020-10-30 DIAGNOSIS — M9903 Segmental and somatic dysfunction of lumbar region: Secondary | ICD-10-CM | POA: Diagnosis not present

## 2020-11-06 DIAGNOSIS — M9902 Segmental and somatic dysfunction of thoracic region: Secondary | ICD-10-CM | POA: Diagnosis not present

## 2020-11-06 DIAGNOSIS — M5431 Sciatica, right side: Secondary | ICD-10-CM | POA: Diagnosis not present

## 2020-11-06 DIAGNOSIS — M9901 Segmental and somatic dysfunction of cervical region: Secondary | ICD-10-CM | POA: Diagnosis not present

## 2020-11-06 DIAGNOSIS — M9903 Segmental and somatic dysfunction of lumbar region: Secondary | ICD-10-CM | POA: Diagnosis not present

## 2020-11-18 DIAGNOSIS — I10 Essential (primary) hypertension: Secondary | ICD-10-CM | POA: Diagnosis not present

## 2020-11-18 DIAGNOSIS — Z23 Encounter for immunization: Secondary | ICD-10-CM | POA: Diagnosis not present

## 2020-11-18 DIAGNOSIS — F3341 Major depressive disorder, recurrent, in partial remission: Secondary | ICD-10-CM | POA: Diagnosis not present

## 2020-11-18 DIAGNOSIS — R519 Headache, unspecified: Secondary | ICD-10-CM | POA: Diagnosis not present

## 2020-11-18 DIAGNOSIS — I480 Paroxysmal atrial fibrillation: Secondary | ICD-10-CM | POA: Diagnosis not present

## 2021-01-17 ENCOUNTER — Encounter: Payer: Self-pay | Admitting: Neurology

## 2021-03-18 NOTE — Progress Notes (Signed)
NEUROLOGY CONSULTATION NOTE  DRAELYN SERVICE MRN: [id number] DOB: 1955-12-15  Referring provider: Milagros Evener, MD Primary care provider: Milagros Evener, MD  Reason for consult:  headache  Assessment/Plan:   Chronic daily headaches, no prior history of migraines but severe episodes with migraine features - not uncommon after COVID but other secondary etiologies should be evaluated.  MRI/MRA of brain Check sed rate and CRP Start topiramate 25mg  at bedtime.  Side effects discussed.  We can increase dose in 4 weeks if needed Limit use of pain relievers to no more than 2 days out of week to prevent risk of rebound or medication-overuse headache. Keep headache diary Follow up 4 months.   Subjective:  Micheal Davila is a 66 year old male with PAF, HTN and ADHD who presents for headaches.  History supplemented by referring provider's note.  Onset:  July 2022, following bout of COVID Location:  across top of head Quality:  throbbing Intensity:  varies - usually 2-3/10 but may reach up to 5 to 8/10  Aura:  absent Prodrome:  absent Associated symptoms:  Mild photophobia.  Rarely, left eye and left temple tender to touch  He denies associated nausea, vomiting, phonophobia, visual disturbance, unilateral numbness or weakness. Duration:  persistent.  More severe headache 5 minutes to an hour Frequency: persistent but severe headache occurs daily. Frequency of abortive medication: rarely - "they don't work" Triggers:  unknown Relieving factors:  rest Activity:  aggravates when severe Had an eye exam and needed slight adjustment in his lens prescription.  No improvement in headaches.  Current NSAIDS/analgesics:  ASA 81mg  daily Current triptans:  none Current ergotamine:  none Current anti-emetic:  none Current muscle relaxants:  none Current Antihypertensive medications:  benazepril-HCTZ Current Antidepressant medications:  bupropion 75mg  daily Current Anticonvulsant  medications:  none Current anti-CGRP:  none Current Vitamins/Herbal/Supplements:  MVI Current Antihistamines/Decongestants:  none Other therapy:  none Hormone/birth control:  none   Past NSAIDS/analgesics:  ibuprofen, acetaminophen Past abortive triptans:  none Past abortive ergotamine:  none Past muscle relaxants:  none Past anti-emetic:  none Past antihypertensive medications:  none Past antidepressant medications:  none Past anticonvulsant medications:  none Past anti-CGRP:  none Past vitamins/Herbal/Supplements:  none Past antihistamines/decongestants:  none Other past therapies:  none  Caffeine:  1 to 4 cups coffee daily, tea Alcohol:  2 cocktails at night Diet:  at least 2 bottles water daily.  Up to 100oz water daily. Does not usually skip meals Exercise:  physical activity at work, lifts, walks daily (works at [employer]) Depression:  controlled; Anxiety:  controlled Other pain:  partially torn right Achilles tendon. Sleep hygiene:  usually okay.  Prior history of OSA resolved with ENT surgery.  He does not think he currently has it. Wakes up once a night. Remote history for severe headache due to ear infection but no history of migraines or other headaches. Family history of headache:  daughter (migraines).  No family history of aneurysms.   PAST MEDICAL HISTORY: Past Medical History:  Diagnosis Date   ADHD (attention deficit hyperactivity disorder)    full eval as an adult though [facility] (not stimulant candidate but can try stratttera)   Atrial fibrillation ([place])    PAF/aflutter 2000; s/p ablation has been in [facility]   Dizziness and giddiness    ED (erectile dysfunction)    GERD (gastroesophageal reflux disease)    Hyperlipidemia    Sleep apnea    was on cpap but never used after ENT  surgery. He never had post-surg f/u   Strain of right hip adductor muscle    Unspecified essential hypertension     PAST SURGICAL HISTORY: No past surgical history on  file.  MEDICATIONS: Current Outpatient Medications on File Prior to Visit  Medication Sig Dispense Refill   aspirin 81 MG tablet Take 81 mg by mouth daily.     benazepril-hydrochlorthiazide (LOTENSIN HCT) 20-25 MG tablet Take 1/2 tablet daily     buPROPion (WELLBUTRIN XL) 150 MG 24 hr tablet Take 150 mg by mouth daily.     flecainide (TAMBOCOR) 100 MG tablet Take 1 tablet (100 mg total) by mouth 2 (two) times daily. 180 tablet 1   Multiple Vitamin (MULTIVITAMIN) tablet Take 1 tablet by mouth daily.     No current facility-administered medications on file prior to visit.    ALLERGIES: Allergies  Allergen Reactions   Strattera [Atomoxetine Hcl] Other (See Comments)    insomnia    FAMILY HISTORY: Family History  Problem Relation Age of Onset   Hypertension Mother    Hypertension Father    Diabetes Father    Heart attack Father    Hypertension Brother    Diabetes Brother    Stroke Neg Hx     Objective:  Blood pressure 127/88, pulse 92, resp. rate 18, height 5\' 7"  (1.702 m), weight 231 lb (104.8 kg), SpO2 99 %. General: No acute distress.  Patient appears well-groomed.   Head:  Normocephalic/atraumatic Eyes:  fundi examined but not visualized Neck: supple, no paraspinal tenderness, full range of motion Back: No paraspinal tenderness Heart: regular rate and rhythm Lungs: Clear to auscultation bilaterally. Vascular: No carotid bruits. Neurological Exam: Mental status: alert and oriented to person, place, and time, recent and remote memory intact, fund of knowledge intact, attention and concentration intact, speech fluent and not dysarthric, language intact. Cranial nerves: CN I: not tested CN II: pupils equal, round and reactive to light, visual fields intact CN III, IV, VI:  full range of motion, no nystagmus, no ptosis CN V: facial sensation intact. CN VII: upper and lower face symmetric CN VIII: hearing intact CN IX, X: gag intact, uvula midline CN XI:  sternocleidomastoid and trapezius muscles intact CN XII: tongue midline Bulk & Tone: normal, no fasciculations. Motor:  muscle strength 5/5 throughout Sensation:  Pinprick sensation reduced on dorsum and first 2 digits of left hand, temperature and vibratory sensation intact. Deep Tendon Reflexes:  2+ throughout,  toes downgoing.   Finger to nose testing:  Without dysmetria.   Heel to shin:  Without dysmetria.   Gait:  Normal station and stride.  Romberg negative.    Thank you for allowing me to take part in the care of this patient.  Metta Clines, DO  CC: Milagros Evener, MD

## 2021-03-19 ENCOUNTER — Ambulatory Visit: Payer: BC Managed Care – PPO | Admitting: Neurology

## 2021-03-19 ENCOUNTER — Other Ambulatory Visit: Payer: Self-pay

## 2021-03-19 ENCOUNTER — Encounter: Payer: Self-pay | Admitting: Neurology

## 2021-03-19 ENCOUNTER — Other Ambulatory Visit (INDEPENDENT_AMBULATORY_CARE_PROVIDER_SITE_OTHER): Payer: BC Managed Care – PPO

## 2021-03-19 VITALS — BP 127/88 | HR 92 | Resp 18 | Ht 67.0 in | Wt 231.0 lb

## 2021-03-19 DIAGNOSIS — R519 Headache, unspecified: Secondary | ICD-10-CM

## 2021-03-19 LAB — SEDIMENTATION RATE: Sed Rate: 10 mm/hr (ref 0–20)

## 2021-03-19 MED ORDER — TOPIRAMATE 25 MG PO TABS: 25.0000 mg | ORAL_TABLET | Freq: Every day | ORAL | 5 refills | Status: DC

## 2021-03-19 NOTE — Patient Instructions (Signed)
Start topiramate 25mg  at bedtime.  If no improvement in 4 weeks, contact me and we can increase dose Limit use of pain relievers to no more than 2 days out of week to prevent risk of rebound or medication-overuse headache. Keep headache diary Check MRI and MRA of head Check sed rate and CRP Follow up 4 months

## 2021-03-20 LAB — BASIC METABOLIC PANEL
BUN: 15 mg/dL (ref 6–23)
CO2: 29 mEq/L (ref 19–32)
Calcium: 9 mg/dL (ref 8.4–10.5)
Chloride: 104 mEq/L (ref 96–112)
Creatinine, Ser: 0.99 mg/dL (ref 0.40–1.50)
GFR: 79.68 mL/min (ref >60.00)
Glucose, Bld: 116 mg/dL — ABNORMAL HIGH (ref 70–99)
Potassium: 4.1 mEq/L (ref 3.5–5.1)
Sodium: 140 mEq/L (ref 135–145)

## 2021-03-20 LAB — C-REACTIVE PROTEIN: CRP: <1 mg/dL (ref 0.5–20.0)

## 2021-03-21 ENCOUNTER — Telehealth: Payer: Self-pay | Admitting: Neurology

## 2021-03-21 NOTE — Progress Notes (Signed)
Pt advised of his labs results

## 2021-03-21 NOTE — Telephone Encounter (Signed)
Patient is returning a call to someone. °

## 2021-03-21 NOTE — Telephone Encounter (Signed)
See results note. 

## 2021-03-21 NOTE — Progress Notes (Signed)
LMOVM for pt, Please give the office a call back.

## 2021-04-04 ENCOUNTER — Encounter: Payer: Self-pay | Admitting: Neurology

## 2021-04-08 ENCOUNTER — Other Ambulatory Visit: Payer: BC Managed Care – PPO

## 2021-04-16 ENCOUNTER — Ambulatory Visit: Payer: BC Managed Care – PPO | Admitting: Neurology

## 2021-04-24 ENCOUNTER — Ambulatory Visit
Admission: RE | Admit: 2021-04-24 | Discharge: 2021-04-24 | Disposition: A | Discharge status: Home / Self Care | Payer: BC Managed Care – PPO | Source: Ambulatory Visit | Attending: Neurology | Admitting: Neurology

## 2021-04-24 DIAGNOSIS — R519 Headache, unspecified: Secondary | ICD-10-CM | POA: Diagnosis not present

## 2021-04-24 MED ORDER — GADOBENATE DIMEGLUMINE 529 MG/ML IV SOLN
20.0000 mL | Freq: Once | INTRAVENOUS | Status: AC | PRN
  Administered 2021-04-24: 20 mL via INTRAVENOUS

## 2021-04-27 ENCOUNTER — Encounter: Payer: Self-pay | Admitting: Neurology

## 2021-04-28 ENCOUNTER — Telehealth: Payer: Self-pay | Admitting: Neurology

## 2021-04-28 NOTE — Telephone Encounter (Signed)
Patient is returning a call. York Spaniel he got a mess to call the office ?

## 2021-04-29 ENCOUNTER — Telehealth: Payer: Self-pay

## 2021-04-29 DIAGNOSIS — K648 Other hemorrhoids: Secondary | ICD-10-CM | POA: Diagnosis not present

## 2021-04-29 DIAGNOSIS — K635 Polyp of colon: Secondary | ICD-10-CM | POA: Diagnosis not present

## 2021-04-29 DIAGNOSIS — I729 Aneurysm of unspecified site: Secondary | ICD-10-CM

## 2021-04-29 DIAGNOSIS — Z1211 Encounter for screening for malignant neoplasm of colon: Secondary | ICD-10-CM | POA: Diagnosis not present

## 2021-04-29 DIAGNOSIS — K5730 Diverticulosis of large intestine without perforation or abscess without bleeding: Secondary | ICD-10-CM | POA: Diagnosis not present

## 2021-04-29 NOTE — Telephone Encounter (Signed)
-----   Message from Drema Dallas, DO sent at 04/27/2021  7:32 PM EDT ----- ?MRI shows no concerning cause for headache.  There is a small aneurysm which I believe is an incidental finding.  Based on size, it has low risk for bleeding but will require monitoring.  I would recommend repeat MRA of head in one year to evaluate for any changes. ?

## 2021-04-29 NOTE — Telephone Encounter (Signed)
Patient advised OF MRI. ?

## 2021-05-21 ENCOUNTER — Encounter: Payer: Self-pay | Admitting: Neurology

## 2021-05-21 DIAGNOSIS — F339 Major depressive disorder, recurrent, unspecified: Secondary | ICD-10-CM | POA: Diagnosis not present

## 2021-05-21 DIAGNOSIS — I10 Essential (primary) hypertension: Secondary | ICD-10-CM | POA: Diagnosis not present

## 2021-05-21 DIAGNOSIS — Z13220 Encounter for screening for lipoid disorders: Secondary | ICD-10-CM | POA: Diagnosis not present

## 2021-05-21 DIAGNOSIS — I480 Paroxysmal atrial fibrillation: Secondary | ICD-10-CM | POA: Diagnosis not present

## 2021-05-21 DIAGNOSIS — E669 Obesity, unspecified: Secondary | ICD-10-CM | POA: Diagnosis not present

## 2021-05-25 ENCOUNTER — Other Ambulatory Visit: Payer: Self-pay | Admitting: Neurology

## 2021-05-25 ENCOUNTER — Encounter: Payer: Self-pay | Admitting: Neurology

## 2021-05-25 MED ORDER — TOPIRAMATE 50 MG PO TABS: 50.0000 mg | ORAL_TABLET | Freq: Every day | ORAL | 3 refills | Status: DC

## 2021-05-28 ENCOUNTER — Encounter: Payer: Self-pay | Admitting: Neurology

## 2021-05-29 ENCOUNTER — Other Ambulatory Visit: Payer: Self-pay | Admitting: Neurology

## 2021-05-29 MED ORDER — SUMATRIPTAN SUCCINATE 100 MG PO TABS: ORAL_TABLET | ORAL | 5 refills | Status: DC

## 2021-06-02 NOTE — Progress Notes (Signed)
?  ?Cardiology Office Note ? ? ?Date:  06/03/2021  ? ?ID:  Micheal Davila, DOB 1955-11-29, MRN [id number] ? ?PCP:  Clayborn Heron, MD  ? ? ?No chief complaint on file. ? ?AFib ? ?Wt Readings from Last 3 Encounters:  ?06/03/21 229 lb (103.9 kg)  ?03/19/21 231 lb (104.8 kg)  ?03/28/20 230 lb 12.8 oz (104.7 kg)  ?  ? ?  ?History of Present Illness: ?Micheal Davila is a 66 y.o. male  who had atrial fluttter/afib starting many years ago. He had palpitations at the time. He was seen in the ER in 2009 and saw a cardiologist. He wore a monitor and was found to have atrial flutter and had an ablation, in [place], [place]. He started on flecainide for AFib. No further sx since being on flecainide. Never took COumadin. He does have HTN since 2011. No diabetes. Many months post ablation, he had an embolus to the left eye. It ws not thought to be related to the ablation per his report. Carotid Doppler did not show any significant stenosis. Negative stress echo in 2009.  ?  ?Brother with RF for CAD.  Mother with valve replacement in the past.  Strong family h/o diabetes.  ?  ?He had COVID, tested positive on Mar 05, 2019.  He had h/a, sinus pressure, mild cough, fatigue. 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 He did not exercise for 3 weeks.   ?  ?After that, his exercise tolerance decreased. He resumed exercise a three weeks after positive test. ?  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   ?  ?Walks a lot at [employer], because he walks and lifts heavy things.   ? ?Has Apple Watch and had arrhythmia in Jan 2022 which resolved.  ? ?Since 2022, no further arryhtmia.   ? ?Denies : Chest pain. Dizziness. Leg edema. Nitroglycerin use. Orthopnea. Palpitations. Paroxysmal nocturnal dyspnea. Shortness of breath. Syncope.  ? ?Walks 7-10 miles on his job.  Works at [employer]. Lifts heavy things.  No problems there.   ? ?Past Medical History:  ?Diagnosis  Date  ? ADHD (attention deficit hyperactivity disorder)   ? full eval as an adult though [facility] (not stimulant candidate but can try stratttera)  ? Atrial fibrillation (HCC)   ? PAF/aflutter 2000; s/p ablation has been in NSR  ? Dizziness and giddiness   ? ED (erectile dysfunction)   ? GERD (gastroesophageal reflux disease)   ? Hyperlipidemia   ? Sleep apnea   ? was on cpap but never used after ENT surgery. He never had post-surg f/u  ? Strain of right hip adductor muscle   ? Unspecified essential hypertension   ? ? ?History reviewed. No pertinent surgical history. ? ? ?Current Outpatient Medications  ?Medication Sig Dispense Refill  ? aspirin 81 MG tablet Take 81 mg by mouth daily.    ? benazepril-hydrochlorthiazide (LOTENSIN HCT) 20-25 MG tablet Take 1/2 tablet daily    ? buPROPion (WELLBUTRIN XL) 150 MG 24 hr tablet Take 150 mg by mouth daily.    ? flecainide (TAMBOCOR) 100 MG tablet Take 1 tablet (100 mg total) by mouth 2 (two) times daily. 180 tablet 1  ? hydrochlorothiazide (MICROZIDE) 12.5 MG capsule TAKE ONE CAPSULE BY MOUTH EVERY MORNING FOR NINETY DAYS    ? Multiple Vitamin (MULTIVITAMIN) tablet Take 1 tablet by mouth daily.    ?  Multiple Vitamins-Minerals (MULTI FOR HIM) TABS 1 tablet    ? SUMAtriptan (IMITREX) 100 MG tablet Take 1 tablet earliest onset of headache.  May repeat in 2 hours if headache persists or recurs.  Maximum 2 tablets in 24 hours. 10 tablet 5  ? topiramate (TOPAMAX) 50 MG tablet Take 1 tablet (50 mg total) by mouth at bedtime. 30 tablet 3  ? ?No current facility-administered medications for this visit.  ? ? ?Allergies:   Strattera [atomoxetine hcl]  ? ? ?Social History:  The patient  reports that he has never smoked. He has never used smokeless tobacco. He reports current alcohol use. He reports that he does not use drugs.  ? ?Family History:  The patient's family history includes Diabetes in his brother and father; Heart attack in his father; Hypertension in his brother,  father, and mother.  ? ? ?ROS:  Please see the history of present illness.   Otherwise, review of systems are positive for poor diet.   All other systems are reviewed and negative.  ? ? ?PHYSICAL EXAM: ?VS:  BP 116/74   Pulse 95   Ht 5\' 7"  (1.702 m)   Wt 229 lb (103.9 kg)   SpO2 96%   BMI 35.87 kg/m?  , BMI Body mass index is 35.87 kg/m?. ?GEN: Well nourished, well developed, in no acute distress ?HEENT: normal ?Neck: no JVD, carotid bruits, or masses ?Cardiac: RRR; no murmurs, rubs, or gallops,no edema  ?Respiratory:  clear to auscultation bilaterally, normal work of breathing ?GI: soft, nontender, nondistended, + BS ?MS: no deformity or atrophy ?Skin: warm and dry, no rash ?Neuro:  Strength and sensation are intact ?Psych: euthymic mood, full affect ? ? ?EKG:   ?The ekg ordered today demonstrates NSR, no ST changes ? ? ?Recent Labs: ?03/19/2021: BUN 15; Creatinine, Ser 0.99; Potassium 4.1; Sodium 140  ? ?Lipid Panel ?No results found for: CHOL, TRIG, HDL, CHOLHDL, VLDL, LDLCALC, LDLDIRECT ?  ?Other studies Reviewed: ?Additional studies/ records that were reviewed today with results demonstrating: labs reviewed. ? ? ?ASSESSMENT AND PLAN: ? ?AFib: Maintaining NSR.  Continue flecainide. No palpitations.  Continue aspirin.  No arrhythmias by his Apple watch ?HTN: The current medical regimen is effective;  continue present plan and medications. ?Obesity: Whole food, plant-based diet.  High-fiber diet.  Avoid processed.  Currently eating a "Southern diet"- whatever his 66 y/o MIL will eat.  He admits that he eats unhealthy.  ?Hyperlipidemia: LDL increased 162, 141 in 99; 116 in 2015.  A calcium scoring test did help determine if there is plaque present and thus affect our decision about starting a cholesterol-lowering medicine.  ?Post COVID: still has headaches since COVID in 2022.  ? ? ?Current medicines are reviewed at length with the patient today.  The patient concerns regarding his medicines were  addressed. ? ?The following changes have been made:  No change ? ?Labs/ tests ordered today include:  ?No orders of the defined types were placed in this encounter. ? ? ?Recommend 150 minutes/week of aerobic exercise ?Low fat, low carb, high fiber diet recommended ? ?Disposition:   FU in 1 yeaer ? ? ?Signed, ?2023, MD  ?06/03/2021 4:36 PM    ?[facility] ?[address] [place], [place], [place]  [place] ?Phone: [phone]; Fax: [phone]  ? ?

## 2021-06-03 ENCOUNTER — Encounter: Payer: Self-pay | Admitting: Interventional Cardiology

## 2021-06-03 ENCOUNTER — Ambulatory Visit: Payer: BC Managed Care – PPO | Admitting: Interventional Cardiology

## 2021-06-03 VITALS — BP 116/74 | HR 95 | Ht 67.0 in | Wt 229.0 lb

## 2021-06-03 DIAGNOSIS — I10 Essential (primary) hypertension: Secondary | ICD-10-CM

## 2021-06-03 DIAGNOSIS — I480 Paroxysmal atrial fibrillation: Secondary | ICD-10-CM | POA: Diagnosis not present

## 2021-06-03 DIAGNOSIS — H349 Unspecified retinal vascular occlusion: Secondary | ICD-10-CM | POA: Diagnosis not present

## 2021-06-03 DIAGNOSIS — E782 Mixed hyperlipidemia: Secondary | ICD-10-CM

## 2021-06-03 NOTE — Patient Instructions (Signed)
Medication Instructions:  Your physician recommends that you continue on your current medications as directed. Please refer to the Current Medication list given to you today.  *If you need a refill on your cardiac medications before your next appointment, please call your pharmacy*   Lab Work: none If you have labs (blood work) drawn today and your tests are completely normal, you will receive your results only by: MyChart Message (if you have MyChart) OR A paper copy in the mail If you have any lab test that is abnormal or we need to change your treatment, we will call you to review the results.   Testing/Procedures: none   Follow-Up: At [facility], you and your health needs are our priority.  As part of our continuing mission to provide you with exceptional heart care, we have created designated Provider Care Teams.  These Care Teams include your primary Cardiologist (physician) and Advanced Practice Providers (APPs -  Physician Assistants and Nurse Practitioners) who all work together to provide you with the care you need, when you need it.  We recommend signing up for the patient portal called "MyChart".  Sign up information is provided on this After Visit Summary.  MyChart is used to connect with patients for Virtual Visits (Telemedicine).  Patients are able to view lab/test results, encounter notes, upcoming appointments, etc.  Non-urgent messages can be sent to your provider as well.   To learn more about what you can do with MyChart, go to [email or web address].    Your next appointment:   12 month(s)  The format for your next appointment:   In Person  Provider:   Jayadeep Varanasi, MD     Other Instructions  High-Fiber Eating Plan Fiber, also called dietary fiber, is a type of carbohydrate. It is found foods such as fruits, vegetables, whole grains, and beans. A high-fiber diet can have many health benefits. Your health care provider may recommend a high-fiber diet  to help: Prevent constipation. Fiber can make your bowel movements more regular. Lower your cholesterol. Relieve the following conditions: Inflammation of veins in the anus (hemorrhoids). Inflammation of specific areas of the digestive tract (uncomplicated diverticulosis). A problem of the large intestine, also called the colon, that sometimes causes pain and diarrhea (irritable bowel syndrome, or IBS). Prevent overeating as part of a weight-loss plan. Prevent heart disease, type 2 diabetes, and certain cancers. What are tips for following this plan? Reading food labels  Check the nutrition facts label on food products for the amount of dietary fiber. Choose foods that have 5 grams of fiber or more per serving. The goals for recommended daily fiber intake include: Men (age 50 or younger): 34-38 g. Men (over age 50): 28-34 g. Women (age 50 or younger): 25-28 g. Women (over age 50): 22-25 g. Your daily fiber goal is _____________ g. Shopping Choose whole fruits and vegetables instead of processed forms, such as apple juice or applesauce. Choose a wide variety of high-fiber foods such as avocados, lentils, oats, and kidney beans. Read the nutrition facts label of the foods you choose. Be aware of foods with added fiber. These foods often have high sugar and sodium amounts per serving. Cooking Use whole-grain flour for baking and cooking. Cook with brown rice instead of white rice. Meal planning Start the day with a breakfast that is high in fiber, such as a cereal that contains 5 g of fiber or more per serving. Eat breads and cereals that are made with whole-grain flour instead of   refined flour or white flour. Eat brown rice, bulgur wheat, or millet instead of white rice. Use beans in place of meat in soups, salads, and pasta dishes. Be sure that half of the grains you eat each day are whole grains. General information You can get the recommended daily intake of dietary fiber  by: Eating a variety of fruits, vegetables, grains, nuts, and beans. Taking a fiber supplement if you are not able to take in enough fiber in your diet. It is better to get fiber through food than from a supplement. Gradually increase how much fiber you consume. If you increase your intake of dietary fiber too quickly, you may have bloating, cramping, or gas. Drink plenty of water to help you digest fiber. Choose high-fiber snacks, such as berries, raw vegetables, nuts, and popcorn. What foods should I eat? Fruits Berries. Pears. Apples. Oranges. Avocado. Prunes and raisins. Dried figs. Vegetables Sweet potatoes. Spinach. Kale. Artichokes. Cabbage. Broccoli. Cauliflower. Green peas. Carrots. Squash. Grains Whole-grain breads. Multigrain cereal. Oats and oatmeal. Brown rice. Barley. Bulgur wheat. Millet. Quinoa. Bran muffins. Popcorn. Rye wafer crackers. Meats and other proteins Navy beans, kidney beans, and pinto beans. Soybeans. Split peas. Lentils. Nuts and seeds. Dairy Fiber-fortified yogurt. Beverages Fiber-fortified soy milk. Fiber-fortified orange juice. Other foods Fiber bars. The items listed above may not be a complete list of recommended foods and beverages. Contact a dietitian for more information. What foods should I avoid? Fruits Fruit juice. Cooked, strained fruit. Vegetables Fried potatoes. Canned vegetables. Well-cooked vegetables. Grains White bread. Pasta made with refined flour. White rice. Meats and other proteins Fatty cuts of meat. Fried chicken or fried fish. Dairy Milk. Yogurt. Cream cheese. Sour cream. Fats and oils Butters. Beverages Soft drinks. Other foods Cakes and pastries. The items listed above may not be a complete list of foods and beverages to avoid. Talk with your dietitian about what choices are best for you. Summary Fiber is a type of carbohydrate. It is found in foods such as fruits, vegetables, whole grains, and beans. A high-fiber  diet has many benefits. It can help to prevent constipation, lower blood cholesterol, aid weight loss, and reduce your risk of heart disease, diabetes, and certain cancers. Increase your intake of fiber gradually. Increasing fiber too quickly may cause cramping, bloating, and gas. Drink plenty of water while you increase the amount of fiber you consume. The best sources of fiber include whole fruits and vegetables, whole grains, nuts, seeds, and beans. This information is not intended to replace advice given to you by your health care provider. Make sure you discuss any questions you have with your health care provider. Document Revised: 05/25/2019 Document Reviewed: 05/25/2019 Elsevier Patient Education  2023 Elsevier Inc.   Important Information About Sugar       

## 2021-06-09 ENCOUNTER — Encounter: Payer: Self-pay | Admitting: Neurology

## 2021-06-19 ENCOUNTER — Other Ambulatory Visit: Payer: Self-pay

## 2021-06-19 MED ORDER — TOPIRAMATE 100 MG PO TABS: 100.0000 mg | ORAL_TABLET | Freq: Every day | ORAL | 1 refills | Status: DC

## 2021-06-19 NOTE — Telephone Encounter (Signed)
Per Dr.Jaffe, Increase topiramate to 100mg  at bedtime and monitor over the next 6 weeks.

## 2021-06-20 ENCOUNTER — Telehealth: Payer: Self-pay

## 2021-06-20 ENCOUNTER — Other Ambulatory Visit: Payer: Self-pay

## 2021-06-20 ENCOUNTER — Other Ambulatory Visit: Payer: Self-pay | Admitting: Neurology

## 2021-06-20 MED ORDER — NORTRIPTYLINE HCL 10 MG PO CAPS: 10.0000 mg | ORAL_CAPSULE | Freq: Every day | ORAL | 1 refills | Status: DC

## 2021-06-20 MED ORDER — TOPIRAMATE 50 MG PO TABS: 75.0000 mg | ORAL_TABLET | Freq: Every day | ORAL | 5 refills | Status: DC

## 2021-06-20 MED ORDER — RIZATRIPTAN BENZOATE 10 MG PO TBDP: 10.0000 mg | ORAL_TABLET | ORAL | 5 refills | Status: DC | PRN

## 2021-06-20 MED ORDER — TOPIRAMATE 50 MG PO TABS: 75.0000 mg | ORAL_TABLET | Freq: Every day | ORAL | 0 refills | Status: DC

## 2021-06-20 NOTE — Telephone Encounter (Signed)
Pt called an informed that Dr Everlena Cooper stated he is treating him with medications used for frequent headaches, not specifically for migraine.  It is important that he is comfortable with the care provided by his doctor.  If he does not agree with my treatment plan, it is okay to ask his PCP for a referral to another neurologist. Pt stated that he understand.

## 2021-06-20 NOTE — Telephone Encounter (Signed)
Pt called see phone note °

## 2021-06-20 NOTE — Telephone Encounter (Signed)
Stop the topiramate 100mg  tablet.  Please send another prescription for Stop the topiramate 100mg  tablet.  Please send another prescription for topiramate 50mg  tablet - take 1 and 1/2 tablets at bedtime (will have him on 75mg  daily instead of 100mg  daily for now).  If tolerating we can increase dose in 4 to 6 weeks if needed.

## 2021-06-20 NOTE — Telephone Encounter (Signed)
Pt called instead of going back and forth with mychart messages and advises that Dr Everlena Cooper stated his plan wasn't to taper off topiramate completely.  Dr Everlena Cooper decreased the dose from 100mg  to 75mg  to see if that dose was more tolerable.  If it is more tolerable and headaches do not improve in 4 weeks, then we can try going back up to 100mg .  you haven't failed the medication because you have only been on low doses.     If you is adamant about stopping the topirmate, then you should just take topiramate 50mg  at bedtime for one week, then stop. Pt stated that he wanted to STOP the topiramate and told that , we can start nortriptyline 10mg  at bedtime.  We can increase dose in 4 weeks if needed. Medication was ordered and sent to pt pharmacy, he said that he would like to try it,   Pt stated that he got a second opinion form his insurance company called 2nd med referral service, this is where you can call and talk to a nurse and they will get you the Dr you need, and then they will request records and look over them and talk to the pt. He stated that the insurance Neurologist told him that he is on the wrong medications and that he does not have migraines he has Covid related headaches and no daily medication can treat that. The Pt stated he was told he needed something to reset his brain to make it think the pain wasn't there such as a pill or an injection. The pt was advised that this is what Dr was trying to do with the head ache medication,

## 2021-06-23 DIAGNOSIS — R519 Headache, unspecified: Secondary | ICD-10-CM | POA: Diagnosis not present

## 2021-06-30 ENCOUNTER — Other Ambulatory Visit: Payer: Self-pay | Admitting: Interventional Cardiology

## 2021-07-22 NOTE — Progress Notes (Deleted)
NEUROLOGY FOLLOW UP OFFICE NOTE  Micheal Davila [id number]  Assessment/Plan:   Chronic daily headaches, no prior history of migraines but severe episodes with migraine features - not uncommon after COVID but other secondary etiologies should be evaluated.   MRI/MRA of brain Check sed rate and CRP Start topiramate 25mg  at bedtime.  Side effects discussed.  We can increase dose in 4 weeks if needed Limit use of pain relievers to no more than 2 days out of week to prevent risk of rebound or medication-overuse headache. Keep headache diary Follow up 4 months.     Subjective:  Micheal Davila is a 66 year old male with PAF, HTN and ADHD who follows up for headache.  UPDATE: Labs from February revealed Sed rate 10 and CRP <1.0.  MRI of brain with and without contrast on 04/24/2021 personally reviewed was unremarkable.  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    Started topiramate for headache.  Subsequently titrated up to 100mg  but he reported side effects, so I had him decrease dose to 75mg  at bedtime.   Intensity:  *** Duration:  *** Frequency:  *** Frequency of abortive medication: *** Current NSAIDS/analgesics:  ASA 81mg  daily Current triptans:  none Current ergotamine:  none Current anti-emetic:  none Current muscle relaxants:  none Current Antihypertensive medications:  benazepril-HCTZ Current Antidepressant medications:  bupropion 75mg  daily Current Anticonvulsant medications:  topiramate 25mg  at bedtime Current anti-CGRP:  Ubrelvy 100mg  Current Vitamins/Herbal/Supplements:  MVI Current Antihistamines/Decongestants:  none Other therapy:  none Hormone/birth control:  none  Caffeine:  1 to 4 cups coffee daily, tea Alcohol:  2 cocktails at night Diet:  at least 2 bottles water daily.  Up to 100oz water daily. Does not usually skip meals Exercise:  physical activity at work, lifts, walks daily (works at  04/26/2021) Depression:  controlled; Anxiety:  controlled Other pain:  partially torn right Achilles tendon. Sleep hygiene:  usually okay.  Prior history of OSA resolved with ENT surgery.  He does not think he currently has it. Wakes up once a night.  HISTORY:  Onset:  July 2022, following bout of COVID Location:  across top of head Quality:  throbbing Intensity:  varies - usually 2-3/10 but may reach up to 5 to 8/10  Aura:  absent Prodrome:  absent Associated symptoms:  Mild photophobia.  Rarely, left eye and left temple tender to touch  He denies associated nausea, vomiting, phonophobia, visual disturbance, unilateral numbness or weakness. Duration:  persistent.  More severe headache 5 minutes to an hour Frequency: persistent but severe headache occurs daily. Frequency of abortive medication: rarely - "they don't work" Triggers:  unknown Relieving factors:  rest Activity:  aggravates when severe Had an eye exam and needed slight adjustment in his lens prescription.  No improvement in headaches.     Past NSAIDS/analgesics:  ibuprofen, acetaminophen Past abortive triptans:  none Past abortive ergotamine:  none Past muscle relaxants:  none Past anti-emetic:  none Past antihypertensive medications:  none Past antidepressant medications:  none Past anticonvulsant medications:  none Past anti-CGRP:  none Past vitamins/Herbal/Supplements:  none Past antihistamines/decongestants:  none Other past therapies:  none    Remote history for severe headache due to ear infection but no history of migraines or other headaches. Family history of headache:  daughter (migraines).  No family history of aneurysms.  PAST MEDICAL HISTORY: Past Medical History:  Diagnosis Date   ADHD (attention deficit  hyperactivity disorder)    full eval as an adult though [facility] (not stimulant candidate but can try stratttera)   Atrial fibrillation (HCC)    PAF/aflutter 2000; s/p ablation has been in  NSR   Dizziness and giddiness    ED (erectile dysfunction)    GERD (gastroesophageal reflux disease)    Hyperlipidemia    Sleep apnea    was on cpap but never used after ENT surgery. He never had post-surg f/u   Strain of right hip adductor muscle    Unspecified essential hypertension     MEDICATIONS: Current Outpatient Medications on File Prior to Visit  Medication Sig Dispense Refill   aspirin 81 MG tablet Take 81 mg by mouth daily.     benazepril-hydrochlorthiazide (LOTENSIN HCT) 20-25 MG tablet Take 1/2 tablet daily     buPROPion (WELLBUTRIN XL) 150 MG 24 hr tablet Take 150 mg by mouth daily.     flecainide (TAMBOCOR) 100 MG tablet TAKE ONE TABLET BY MOUTH TWICE A DAY 180 tablet 2   hydrochlorothiazide (MICROZIDE) 12.5 MG capsule TAKE ONE CAPSULE BY MOUTH EVERY MORNING FOR NINETY DAYS     Multiple Vitamin (MULTIVITAMIN) tablet Take 1 tablet by mouth daily.     Multiple Vitamins-Minerals (MULTI FOR HIM) TABS 1 tablet     nortriptyline (PAMELOR) 10 MG capsule Take 1 capsule (10 mg total) by mouth at bedtime. 30 capsule 1   rizatriptan (MAXALT-MLT) 10 MG disintegrating tablet Take 1 tablet (10 mg total) by mouth as needed for migraine (As needed, May repeat after 2 hours.  Maximum 2 tablets in 24 hours.). May repeat in 2 hours if needed 9 tablet 5   topiramate (TOPAMAX) 50 MG tablet Take 1.5 tablets (75 mg total) by mouth at bedtime. 45 tablet 5   No current facility-administered medications on file prior to visit.    ALLERGIES: Allergies  Allergen Reactions   Strattera [Atomoxetine Hcl] Other (See Comments)    insomnia    FAMILY HISTORY: Family History  Problem Relation Age of Onset   Hypertension Mother    Hypertension Father    Diabetes Father    Heart attack Father    Hypertension Brother    Diabetes Brother    Stroke Neg Hx       Objective:  *** General: No acute distress.  Patient appears ***-groomed.   Head:  Normocephalic/atraumatic Eyes:  Fundi examined  but not visualized Neck: supple, no paraspinal tenderness, full range of motion Heart:  Regular rate and rhythm Lungs:  Clear to auscultation bilaterally Back: No paraspinal tenderness Neurological Exam: alert and oriented to person, place, and time.  Speech fluent and not dysarthric, language intact.  CN II-XII intact. Bulk and tone normal, muscle strength 5/5 throughout.  Sensation to light touch intact.  Deep tendon reflexes 2+ throughout, toes downgoing.  Finger to nose testing intact.  Gait normal, Romberg negative.   Shon Millet, DO  CC: ***

## 2021-07-23 ENCOUNTER — Ambulatory Visit: Payer: BC Managed Care – PPO | Admitting: Neurology

## 2021-07-23 ENCOUNTER — Encounter: Payer: Self-pay | Admitting: Neurology

## 2021-07-23 DIAGNOSIS — Z029 Encounter for administrative examinations, unspecified: Secondary | ICD-10-CM

## 2021-12-24 DIAGNOSIS — H6123 Impacted cerumen, bilateral: Secondary | ICD-10-CM | POA: Diagnosis not present

## 2022-01-07 DIAGNOSIS — R112 Nausea with vomiting, unspecified: Secondary | ICD-10-CM | POA: Diagnosis not present

## 2022-01-28 DIAGNOSIS — S83421A Sprain of lateral collateral ligament of right knee, initial encounter: Secondary | ICD-10-CM | POA: Diagnosis not present

## 2022-01-31 DIAGNOSIS — M25561 Pain in right knee: Secondary | ICD-10-CM | POA: Diagnosis not present

## 2022-02-06 DIAGNOSIS — E669 Obesity, unspecified: Secondary | ICD-10-CM | POA: Diagnosis not present

## 2022-02-06 DIAGNOSIS — I10 Essential (primary) hypertension: Secondary | ICD-10-CM | POA: Diagnosis not present

## 2022-02-06 DIAGNOSIS — I480 Paroxysmal atrial fibrillation: Secondary | ICD-10-CM | POA: Diagnosis not present

## 2022-02-06 DIAGNOSIS — F3341 Major depressive disorder, recurrent, in partial remission: Secondary | ICD-10-CM | POA: Diagnosis not present

## 2022-02-09 DIAGNOSIS — M23203 Derangement of unspecified medial meniscus due to old tear or injury, right knee: Secondary | ICD-10-CM | POA: Diagnosis not present

## 2022-02-15 DIAGNOSIS — M25561 Pain in right knee: Secondary | ICD-10-CM | POA: Diagnosis not present

## 2022-02-27 DIAGNOSIS — S83241A Other tear of medial meniscus, current injury, right knee, initial encounter: Secondary | ICD-10-CM | POA: Diagnosis not present

## 2022-02-27 DIAGNOSIS — Z23 Encounter for immunization: Secondary | ICD-10-CM | POA: Diagnosis not present

## 2022-03-03 ENCOUNTER — Telehealth: Payer: Self-pay | Admitting: *Deleted

## 2022-03-03 NOTE — Telephone Encounter (Signed)
I s/w the pt and he has been scheduled for tele pre op appt 03/10/22 @ 10:20. Med rec and consent are done.     Patient Consent for Virtual Visit        Micheal Davila has provided verbal consent on 03/03/2022 for a virtual visit (video or telephone).   CONSENT FOR VIRTUAL VISIT FOR:  Micheal Davila  By participating in this virtual visit I agree to the following:  I hereby voluntarily request, consent and authorize [facility] and its employed or contracted physicians, physician assistants, nurse practitioners or other licensed health care professionals (the Practitioner), to provide me with telemedicine health care services (the "Services") as deemed necessary by the treating Practitioner. I acknowledge and consent to receive the Services by the Practitioner via telemedicine. I understand that the telemedicine visit will involve communicating with the Practitioner through live audiovisual communication technology and the disclosure of certain medical information by electronic transmission. I acknowledge that I have been given the opportunity to request an in-person assessment or other available alternative prior to the telemedicine visit and am voluntarily participating in the telemedicine visit.  I understand that I have the right to withhold or withdraw my consent to the use of telemedicine in the course of my care at any time, without affecting my right to future care or treatment, and that the Practitioner or I may terminate the telemedicine visit at any time. I understand that I have the right to inspect all information obtained and/or recorded in the course of the telemedicine visit and may receive copies of available information for a reasonable fee.  I understand that some of the potential risks of receiving the Services via telemedicine include:  Delay or interruption in medical evaluation due to technological equipment failure or disruption; Information transmitted may not  be sufficient (e.g. poor resolution of images) to allow for appropriate medical decision making by the Practitioner; and/or  In rare instances, security protocols could fail, causing a breach of personal health information.  Furthermore, I acknowledge that it is my responsibility to provide information about my medical history, conditions and care that is complete and accurate to the best of my ability. I acknowledge that Practitioner's advice, recommendations, and/or decision may be based on factors not within their control, such as incomplete or inaccurate data provided by me or distortions of diagnostic images or specimens that may result from electronic transmissions. I understand that the practice of medicine is not an exact science and that Practitioner makes no warranties or guarantees regarding treatment outcomes. I acknowledge that a copy of this consent can be made available to me via my patient portal ([place]), or I can request a printed copy by calling the office of [facility].    I understand that my insurance will be billed for this visit.   I have read or had this consent read to me. I understand the contents of this consent, which adequately explains the benefits and risks of the Services being provided via telemedicine.  I have been provided ample opportunity to ask questions regarding this consent and the Services and have had my questions answered to my satisfaction. I give my informed consent for the services to be provided through the use of telemedicine in my medical care

## 2022-03-03 NOTE — Telephone Encounter (Signed)
[place], MD   Preoperative team, please contact this patient and set up a phone call appointment for further preoperative risk assessment. Please obtain consent and complete medication review. Thank you for your help.   Per office protocol, pending no symptoms of ACS, he may hold aspirin for 5 to 7 days preoperatively and should resume as soon as hemodynamically stable in the postoperative.   Emmaline Life, NP-C  03/03/2022, 10:37 AM [address] Office [phone] Fax [phone]

## 2022-03-03 NOTE — Telephone Encounter (Signed)
   Pre-operative Risk Assessment    Patient Name: Micheal Davila  DOB: [birth year] MRN: [id number]      Request for Surgical Clearance    Procedure:   RIGHT KNEE SCOPE PMM vs REPAIR  Date of Surgery:  Clearance TBD                                 Surgeon:  DR. Jonn Shingles Surgeon's Group or Practice Name:  [facility] Phone number:  [phone] ATTN: [place] Fax number:  [phone]   Type of Clearance Requested:   - Medical ; ASA    Type of Anesthesia:  General WITH KNEE BLOCK   Additional requests/questions:    Jiles Prows   03/03/2022, 10:08 AM

## 2022-03-03 NOTE — Telephone Encounter (Signed)
I s/w the pt and he has been scheduled for tele pre op appt 03/10/22 @ 10:20. Med rec and consent are done.

## 2022-03-10 ENCOUNTER — Ambulatory Visit: Payer: BC Managed Care – PPO | Attending: Internal Medicine | Admitting: Nurse Practitioner

## 2022-03-10 DIAGNOSIS — Z01810 Encounter for preprocedural cardiovascular examination: Secondary | ICD-10-CM

## 2022-03-10 NOTE — Progress Notes (Signed)
Virtual Visit via Telephone Note   Because of Micheal Davila's co-morbid illnesses, he is at least at moderate risk for complications without adequate follow up.  This format is felt to be most appropriate for this patient at this time.  The patient did not have access to video technology/had technical difficulties with video requiring transitioning to audio format only (telephone).  All issues noted in this document were discussed and addressed.  No physical exam could be performed with this format.  Please refer to the patient's chart for his consent to telehealth for [facility].  Evaluation Performed:  Preoperative cardiovascular risk assessment _____________   Date:  03/10/2022   Patient ID:  Micheal Davila, DOB [birth year], MRN [id number] Patient Location:  Home Provider location:   Office  Primary Care Provider:  Aretta Nip, MD Primary Cardiologist:  Larae Grooms, MD  Chief Complaint / Patient Profile   67 y.o. y/o male with a h/o paroxysmal atrial fibrillation/flutter, hypertension, hyperlipidemia, and obesity who is pending knee scope PMM versus repair with Dr. Jonn Shingles of [facility] and presents today for telephonic preoperative cardiovascular risk assessment.  History of Present Illness    Micheal Davila is a 67 y.o. male who presents via audio/video conferencing for a telehealth visit today.  Pt was last seen in cardiology clinic on 06/03/2021 by Dr. Irish Lack.  At that time Micheal Davila was doing well.  The patient is now pending procedure as outlined above. Since his last visit, he has done well from a cardiac standpoint.   He denies chest pain, palpitations, dyspnea, pnd, orthopnea, n, v, dizziness, syncope, edema, weight gain, or early satiety. All other systems reviewed and are otherwise negative except as noted above.   Past Medical History    Past Medical History:  Diagnosis Date   ADHD (attention deficit hyperactivity  disorder)    full eval as an adult though [facility] (not stimulant candidate but can try stratttera)   Atrial fibrillation ([place])    PAF/aflutter 2000; s/p ablation has been in [place]   Dizziness and giddiness    ED (erectile dysfunction)    GERD (gastroesophageal reflux disease)    Hyperlipidemia    Sleep apnea    was on cpap but never used after ENT surgery. He never had post-surg f/u   Strain of right hip adductor muscle    Unspecified essential hypertension    History reviewed. No pertinent surgical history.  Allergies  Allergies  Allergen Reactions   Strattera [Atomoxetine Hcl] Other (See Comments)    insomnia    Home Medications    Prior to Admission medications   Medication Sig Start Date End Date Taking? Authorizing Provider  aspirin 81 MG tablet Take 81 mg by mouth daily.    [provider]  benazepril-hydrochlorthiazide (LOTENSIN HCT) 20-25 MG tablet Take 1/2 tablet daily 12/12/15   Jettie Booze, MD  buPROPion (WELLBUTRIN XL) 150 MG 24 hr tablet Take 150 mg by mouth daily.    [provider]  flecainide (TAMBOCOR) 100 MG tablet TAKE ONE TABLET BY MOUTH TWICE A DAY 07/01/21   Jettie Booze, MD  hydrochlorothiazide (MICROZIDE) 12.5 MG capsule TAKE ONE CAPSULE BY MOUTH EVERY MORNING FOR NINETY DAYS    [provider]  Multiple Vitamin (MULTIVITAMIN) tablet Take 1 tablet by mouth daily.    [provider]  Multiple Vitamins-Minerals (MULTI FOR HIM) TABS 1 tablet    [provider]  nortriptyline (PAMELOR)  10 MG capsule Take 1 capsule (10 mg total) by mouth at bedtime. 06/20/21   Pieter Partridge, DO  rizatriptan (MAXALT-MLT) 10 MG disintegrating tablet Take 1 tablet (10 mg total) by mouth as needed for migraine (As needed, May repeat after 2 hours.  Maximum 2 tablets in 24 hours.). May repeat in 2 hours if needed 06/20/21   Pieter Partridge, DO  topiramate (TOPAMAX) 50 MG tablet Take 1.5 tablets (75 mg total) by mouth at  bedtime. 06/20/21   Pieter Partridge, DO    Physical Exam    Vital Signs:  Micheal Davila does not have vital signs available for review today.  Given telephonic nature of communication, physical exam is limited. AAOx3. NAD. Normal affect.  Speech and respirations are unlabored.  Accessory Clinical Findings    None  Assessment & Plan    1.  Preoperative Cardiovascular Risk Assessment:  According to the Revised Cardiac Risk Index (RCRI), his Perioperative Risk of Major Cardiac Event is (%): 0.4. His Functional Capacity in METs is: 7.99 according to the Duke Activity Status Index (DASI). Therefore, based on ACC/AHA guidelines, patient would be at acceptable risk for the planned procedure without further cardiovascular testing.  The patient was advised that if he develops new symptoms prior to surgery to contact our office to arrange for a follow-up visit, and he verbalized understanding.  Per office protocol, he may hold aspirin for 5 to 7 days preoperatively and should resume as soon as hemodynamically stable in the postoperative.   A copy of this note will be routed to requesting surgeon.  Time:   Today, I have spent 5 minutes with the patient with telehealth technology discussing medical history, symptoms, and management plan.     Lenna Sciara, NP  03/10/2022, 10:30 AM

## 2022-03-26 DIAGNOSIS — S83241A Other tear of medial meniscus, current injury, right knee, initial encounter: Secondary | ICD-10-CM | POA: Diagnosis not present

## 2022-03-26 DIAGNOSIS — X58XXXA Exposure to other specified factors, initial encounter: Secondary | ICD-10-CM | POA: Diagnosis not present

## 2022-03-26 DIAGNOSIS — Y999 Unspecified external cause status: Secondary | ICD-10-CM | POA: Diagnosis not present

## 2022-03-26 DIAGNOSIS — M94261 Chondromalacia, right knee: Secondary | ICD-10-CM | POA: Diagnosis not present

## 2022-03-26 DIAGNOSIS — G8918 Other acute postprocedural pain: Secondary | ICD-10-CM | POA: Diagnosis not present

## 2022-04-02 DIAGNOSIS — M25561 Pain in right knee: Secondary | ICD-10-CM | POA: Diagnosis not present

## 2022-04-08 DIAGNOSIS — M25561 Pain in right knee: Secondary | ICD-10-CM | POA: Diagnosis not present

## 2022-04-14 DIAGNOSIS — M25561 Pain in right knee: Secondary | ICD-10-CM | POA: Diagnosis not present

## 2022-04-16 DIAGNOSIS — M25561 Pain in right knee: Secondary | ICD-10-CM | POA: Diagnosis not present

## 2022-04-21 DIAGNOSIS — M25561 Pain in right knee: Secondary | ICD-10-CM | POA: Diagnosis not present

## 2022-04-23 DIAGNOSIS — M25561 Pain in right knee: Secondary | ICD-10-CM | POA: Diagnosis not present

## 2022-05-25 ENCOUNTER — Other Ambulatory Visit: Payer: Self-pay | Admitting: Interventional Cardiology

## 2022-06-11 NOTE — Progress Notes (Signed)
Office Visit    Patient Name: Micheal Davila Date of Encounter: 06/11/2022  Primary Care Provider:  Clayborn Heron, MD Primary Cardiologist:  Lance Muss, MD Primary Electrophysiologist: None   Past Medical History    Past Medical History:  Diagnosis Date   ADHD (attention deficit hyperactivity disorder)    full eval as an adult though [facility] (not stimulant candidate but can try stratttera)   Atrial fibrillation [facility])    PAF/aflutter 2000; s/p ablation has been in NSR   Dizziness and giddiness    ED (erectile dysfunction)    GERD (gastroesophageal reflux disease)    Hyperlipidemia    Sleep apnea    was on cpap but never used after ENT surgery. He never had post-surg f/u   Strain of right hip adductor muscle    Unspecified essential hypertension    No past surgical history on file.  Allergies  Allergies  Allergen Reactions   Strattera [Atomoxetine Hcl] Other (See Comments)    insomnia     History of Present Illness    Micheal Davila  is a 67 year old male with a PMH of PAF s/p ablation 2000 in [place], HTN, HLD, obesity, ADHD who presents today for 1 year follow-up.  Micheal Davila was initially seen by Dr. Eldridge Dace in 2016 for management of PAF.  He was seen in the ER in 2000 and wore a event monitor that showed atrial flutter.  He had a negative stress echo in 2009.  He is currently on flecainide for rhythm control and is currently not on anticoagulation.  He does take a baby aspirin 81 mg.  Patient has declined anticoagulation in the past.  He was last seen on 06/03/2021 and reported no recurrence of arrhythmia by his Apple Watch.  He denied any chest pain or shortness of breath.  His blood pressure was controlled and patient was encouraged to lose weight and try a whole food plant-based diet.  Since last being seen in the office patient reports that he has been doing well with no new cardiac complaints.  His blood pressure has been  well-controlled and is currently 112/70.  He is maintaining sinus rhythm and EKG today shows heart rate of 86 bpm.  He is compliant with his current medications and denies any adverse reactions.  He is currently making dietary changes to help with losing weight.  He reports difficulty with preparing meals due to his 33 year old mother who lives with him.  He suffers from chronic migraines due to long COVID.  He was treated with 3 medications by his neurologist but he has decided to discontinue due to adverse reactions.  Patient denies chest pain, palpitations, dyspnea, PND, orthopnea, nausea, vomiting, dizziness, syncope, edema, weight gain, or early satiety.   Home Medications    Current Outpatient Medications  Medication Sig Dispense Refill   aspirin 81 MG tablet Take 81 mg by mouth daily.     benazepril-hydrochlorthiazide (LOTENSIN HCT) 20-25 MG tablet Take 1/2 tablet daily     buPROPion (WELLBUTRIN XL) 150 MG 24 hr tablet Take 150 mg by mouth daily.     flecainide (TAMBOCOR) 100 MG tablet TAKE 1 TABLET BY MOUTH TWICE A DAY 180 tablet 0   hydrochlorothiazide (MICROZIDE) 12.5 MG capsule TAKE ONE CAPSULE BY MOUTH EVERY MORNING FOR NINETY DAYS     Multiple Vitamin (MULTIVITAMIN) tablet Take 1 tablet by mouth daily.     Multiple Vitamins-Minerals (MULTI FOR HIM) TABS 1 tablet  nortriptyline (PAMELOR) 10 MG capsule Take 1 capsule (10 mg total) by mouth at bedtime. 30 capsule 1   rizatriptan (MAXALT-MLT) 10 MG disintegrating tablet Take 1 tablet (10 mg total) by mouth as needed for migraine (As needed, May repeat after 2 hours.  Maximum 2 tablets in 24 hours.). May repeat in 2 hours if needed 9 tablet 5   topiramate (TOPAMAX) 50 MG tablet Take 1.5 tablets (75 mg total) by mouth at bedtime. 45 tablet 5   No current facility-administered medications for this visit.     Review of Systems  Please see the history of present illness.    (+) Anxiety (+) Headaches  All other systems reviewed and  are otherwise negative except as noted above.  Physical Exam    Wt Readings from Last 3 Encounters:  06/03/21 229 lb (103.9 kg)  03/19/21 231 lb (104.8 kg)  03/28/20 230 lb 12.8 oz (104.7 kg)   ZO:XWRUE were no vitals filed for this visit.,There is no height or weight on file to calculate BMI.  Constitutional:      Appearance: Healthy appearance. Not in distress.  Neck:     Vascular: JVD normal.  Pulmonary:     Effort: Pulmonary effort is normal.     Breath sounds: No wheezing. No rales. Diminished in the bases Cardiovascular:     Normal rate. Regular rhythm. Normal S1. Normal S2.      Murmurs: There is no murmur.  Edema:    Peripheral edema absent.  Abdominal:     Palpations: Abdomen is soft non tender. There is no hepatomegaly.  Skin:    General: Skin is warm and dry.  Neurological:     General: No focal deficit present.     Mental Status: Alert and oriented to person, place and time.     Cranial Nerves: Cranial nerves are intact.  EKG/LABS/ Recent Cardiac Studies    ECG personally reviewed by me today -sinus rhythm with rate of 86 bpm and no acute changes consistent with previous EKG.      Risk Assessment/Calculations:    No results found for: "WBC", "HGB", "HCT", "MCV", "PLT" Lab Results  Component Value Date   CREATININE 0.99 03/19/2021   BUN 15 03/19/2021   NA 140 03/19/2021   K 4.1 03/19/2021   CL 104 03/19/2021   CO2 29 03/19/2021   No results found for: "ALT", "AST", "GGT", "ALKPHOS", "BILITOT" No results found for: "CHOL", "HDL", "LDLCALC", "LDLDIRECT", "TRIG", "CHOLHDL"  No results found for: "HGBA1C"   Assessment & Plan    1.  Paroxysmal AF: -Patient reports no recurrence since previous visit. -EKG completed today showing sinus rhythm and rate of 80 bpm. -Patient is tolerating flecainide with no adverse reactions. -Patient is currently not on oral anticoagulation.  - Continue ASA 81 mg  2.  Essential hypertension: -Patient's blood pressure  today was well-controlled at 112/70 -Continue Lotensin HCTZ 20-25 mg daily, continue HCTZ 12.5 mg daily  3.  Obesity: -Patient's BMI is 35.87 kg/m -He is currently working on increasing his physical activity and has made some dietary changes.    4.  Hyperlipidemia: -Patient's last LDL cholesterol was 162 and labs have been requested from his PCP. -He is currently not on statin therapy -Patient will complete calcium scoring and rule out possible coronary disease. -BMET today   Disposition: Follow-up with Lance Muss, MD or APP in 12 months    Medication Adjustments/Labs and Tests Ordered: Current medicines are reviewed at length with the patient  today.  Concerns regarding medicines are outlined above.   Signed, Napoleon Form, Leodis Rains, NP 06/11/2022, 7:51 PM [facility]

## 2022-06-12 ENCOUNTER — Encounter: Payer: Self-pay | Admitting: Nurse Practitioner

## 2022-06-12 ENCOUNTER — Ambulatory Visit: Payer: BC Managed Care – PPO | Attending: Nurse Practitioner | Admitting: Nurse Practitioner

## 2022-06-12 VITALS — BP 112/70 | HR 86 | Ht 67.0 in | Wt 233.4 lb

## 2022-06-12 DIAGNOSIS — E785 Hyperlipidemia, unspecified: Secondary | ICD-10-CM | POA: Diagnosis not present

## 2022-06-12 DIAGNOSIS — I10 Essential (primary) hypertension: Secondary | ICD-10-CM

## 2022-06-12 DIAGNOSIS — I480 Paroxysmal atrial fibrillation: Secondary | ICD-10-CM

## 2022-06-12 DIAGNOSIS — E669 Obesity, unspecified: Secondary | ICD-10-CM

## 2022-06-12 NOTE — Patient Instructions (Signed)
Medication Instructions:  Your physician recommends that you continue on your current medications as directed. Please refer to the Current Medication list given to you today. *If you need a refill on your cardiac medications before your next appointment, please call your pharmacy*   Lab Work: TODAY-BMET If you have labs (blood work) drawn today and your tests are completely normal, you will receive your results only by: MyChart Message (if you have MyChart) OR A paper copy in the mail If you have any lab test that is abnormal or we need to change your treatment, we will call you to review the results.   Testing/Procedures: CALCIUM SCORE   Follow-Up: At [facility], you and your health needs are our priority.  As part of our continuing mission to provide you with exceptional heart care, we have created designated Provider Care Teams.  These Care Teams include your primary Cardiologist (physician) and Advanced Practice Providers (APPs -  Physician Assistants and Nurse Practitioners) who all work together to provide you with the care you need, when you need it.  We recommend signing up for the patient portal called "MyChart".  Sign up information is provided on this After Visit Summary.  MyChart is used to connect with patients for Virtual Visits (Telemedicine).  Patients are able to view lab/test results, encounter notes, upcoming appointments, etc.  Non-urgent messages can be sent to your provider as well.   To learn more about what you can do with MyChart, go to [email or web address].    Your next appointment:   12 month(s)  Provider:   Lance Muss, MD     Other Instructions

## 2022-06-13 LAB — BASIC METABOLIC PANEL
BUN/Creatinine Ratio: 16 (ref 10–24)
BUN: 17 mg/dL (ref 8–27)
CO2: 23 mmol/L (ref 20–29)
Calcium: 9.3 mg/dL (ref 8.6–10.2)
Chloride: 105 mmol/L (ref 96–106)
Creatinine, Ser: 1.05 mg/dL (ref 0.76–1.27)
Glucose: 89 mg/dL (ref 70–99)
Potassium: 4 mmol/L (ref 3.5–5.2)
Sodium: 142 mmol/L (ref 134–144)
eGFR: 78 mL/min/{1.73_m2} (ref >59)

## 2022-06-19 DIAGNOSIS — I10 Essential (primary) hypertension: Secondary | ICD-10-CM | POA: Diagnosis not present

## 2022-06-19 DIAGNOSIS — M10071 Idiopathic gout, right ankle and foot: Secondary | ICD-10-CM | POA: Diagnosis not present

## 2022-07-10 DIAGNOSIS — S6991XA Unspecified injury of right wrist, hand and finger(s), initial encounter: Secondary | ICD-10-CM | POA: Diagnosis not present

## 2022-08-07 DIAGNOSIS — N529 Male erectile dysfunction, unspecified: Secondary | ICD-10-CM | POA: Diagnosis not present

## 2022-08-07 DIAGNOSIS — I10 Essential (primary) hypertension: Secondary | ICD-10-CM | POA: Diagnosis not present

## 2022-08-07 DIAGNOSIS — E782 Mixed hyperlipidemia: Secondary | ICD-10-CM | POA: Diagnosis not present

## 2022-08-14 DIAGNOSIS — Z03818 Encounter for observation for suspected exposure to other biological agents ruled out: Secondary | ICD-10-CM | POA: Diagnosis not present

## 2022-08-14 DIAGNOSIS — R52 Pain, unspecified: Secondary | ICD-10-CM | POA: Diagnosis not present

## 2022-08-14 DIAGNOSIS — I480 Paroxysmal atrial fibrillation: Secondary | ICD-10-CM | POA: Diagnosis not present

## 2022-08-14 DIAGNOSIS — I10 Essential (primary) hypertension: Secondary | ICD-10-CM | POA: Diagnosis not present

## 2022-09-11 DIAGNOSIS — I10 Essential (primary) hypertension: Secondary | ICD-10-CM | POA: Diagnosis not present

## 2022-09-26 ENCOUNTER — Other Ambulatory Visit: Payer: Self-pay | Admitting: Interventional Cardiology

## 2022-10-01 NOTE — Telephone Encounter (Signed)
 Called and spoke with the patient about MRI findings.  We discussed these results and the patient would like to be seen by hand specialist.  He is requesting to go to [facility] in [place] as his wife is previously been there.  I have placed an external referral to hand specialty.  Patient was appreciative the phone call and will follow-up with them for future hand issues.

## 2022-10-21 DIAGNOSIS — I10 Essential (primary) hypertension: Secondary | ICD-10-CM | POA: Diagnosis not present

## 2022-11-23 ENCOUNTER — Telehealth: Payer: Self-pay

## 2022-11-23 NOTE — Telephone Encounter (Signed)
Received a message from check out stating the patient was unaware of a CT he needed to do. Contacted the patient to explain to him that him and Alden Server discussed a Calcium score at his last appt. He states yes he remembers the conversation and would like to schedule his calcium score.  He then requested a callback to schedule. He states the woman he spoke with confused him by calling the test a CT, but he is more than happy to get the test scheduled.   Message sent to scheduling to contact the patient and schedule the test.   Patient agreeable and voiced understanding.

## 2022-11-27 DIAGNOSIS — S63591A Other specified sprain of right wrist, initial encounter: Secondary | ICD-10-CM | POA: Diagnosis not present

## 2022-11-27 DIAGNOSIS — G5601 Carpal tunnel syndrome, right upper limb: Secondary | ICD-10-CM | POA: Diagnosis not present

## 2022-12-10 DIAGNOSIS — G5621 Lesion of ulnar nerve, right upper limb: Secondary | ICD-10-CM | POA: Diagnosis not present

## 2022-12-15 ENCOUNTER — Ambulatory Visit (HOSPITAL_COMMUNITY)
Admission: RE | Admit: 2022-12-15 | Discharge: 2022-12-15 | Disposition: A | Discharge status: Home / Self Care | Payer: BC Managed Care – PPO | Source: Ambulatory Visit | Attending: Nurse Practitioner | Admitting: Nurse Practitioner

## 2022-12-15 DIAGNOSIS — I480 Paroxysmal atrial fibrillation: Secondary | ICD-10-CM | POA: Insufficient documentation

## 2022-12-15 DIAGNOSIS — E785 Hyperlipidemia, unspecified: Secondary | ICD-10-CM | POA: Insufficient documentation

## 2022-12-15 DIAGNOSIS — E66811 Obesity, class 1: Secondary | ICD-10-CM | POA: Insufficient documentation

## 2022-12-15 DIAGNOSIS — I10 Essential (primary) hypertension: Secondary | ICD-10-CM | POA: Insufficient documentation

## 2022-12-17 ENCOUNTER — Telehealth: Payer: Self-pay

## 2022-12-17 DIAGNOSIS — E785 Hyperlipidemia, unspecified: Secondary | ICD-10-CM

## 2022-12-17 NOTE — Telephone Encounter (Signed)
Spoke with the patient and gave him Alden Server recommends he voiced understanding. Patient states his pcp gave him 20mg  of Crestor about 4 weeks ago.   Crestor added to med list.  I advised the patient that I would speak with Alden Server, make him aware of Crestor dose and verify how soon he would like for labs to be drawn. Patient agreeable and voiced understanding.

## 2022-12-17 NOTE — Telephone Encounter (Signed)
Attempted to reach the patient ; no answer. Left voicemail advising the patient to continue to follow up with his pcp for treatment of elevated calcium score. Advised a call back with any questions or concerns.

## 2022-12-17 NOTE — Telephone Encounter (Signed)
Gaston Islam., NP  Bennie Hind hours ago (10:41 AM)    Thanks for the update and patient can follow with PCP.  Robin Searing, NP

## 2022-12-17 NOTE — Telephone Encounter (Signed)
-----   Message from Napoleon Form, Leodis Rains sent at 12/16/2022  7:51 AM EST ----- Please let patient know that his calcium score was elevated showing calcium in all 3 coronary arteries with most significant in the left anterior descending artery.  Your total calcium score was 79.  The current guidelines recommend aspirin and statin therapy based on your test results.  Please add Crestor 10 mg daily check LFTs and lipids in 8 weeks.  Please let me know if you have any further questions.  Robin Searing, NP

## 2022-12-28 ENCOUNTER — Other Ambulatory Visit: Payer: Self-pay | Admitting: Interventional Cardiology

## 2023-01-14 DIAGNOSIS — G5621 Lesion of ulnar nerve, right upper limb: Secondary | ICD-10-CM | POA: Diagnosis not present

## 2023-02-04 DIAGNOSIS — Z125 Encounter for screening for malignant neoplasm of prostate: Secondary | ICD-10-CM | POA: Diagnosis not present

## 2023-02-04 DIAGNOSIS — E782 Mixed hyperlipidemia: Secondary | ICD-10-CM | POA: Diagnosis not present

## 2023-02-04 DIAGNOSIS — I10 Essential (primary) hypertension: Secondary | ICD-10-CM | POA: Diagnosis not present

## 2023-02-09 DIAGNOSIS — I10 Essential (primary) hypertension: Secondary | ICD-10-CM | POA: Diagnosis not present

## 2023-02-09 DIAGNOSIS — F3341 Major depressive disorder, recurrent, in partial remission: Secondary | ICD-10-CM | POA: Diagnosis not present

## 2023-02-09 DIAGNOSIS — Z23 Encounter for immunization: Secondary | ICD-10-CM | POA: Diagnosis not present

## 2023-02-09 DIAGNOSIS — Z0000 Encounter for general adult medical examination without abnormal findings: Secondary | ICD-10-CM | POA: Diagnosis not present

## 2023-02-09 DIAGNOSIS — E782 Mixed hyperlipidemia: Secondary | ICD-10-CM | POA: Diagnosis not present

## 2023-06-29 ENCOUNTER — Other Ambulatory Visit: Payer: Self-pay

## 2023-06-29 MED ORDER — FLECAINIDE ACETATE 100 MG PO TABS: 100.0000 mg | ORAL_TABLET | Freq: Two times a day (BID) | ORAL | 0 refills | Status: DC

## 2023-07-02 DIAGNOSIS — J019 Acute sinusitis, unspecified: Secondary | ICD-10-CM | POA: Diagnosis not present

## 2023-08-09 NOTE — Progress Notes (Signed)
 Cardiology Office Note    Patient Name: Charlie DELENA Scripture Date of Encounter: 08/09/2023  Primary Care Provider:  Loretha Richerd SAUNDERS, MD Primary Cardiologist:  Candyce Reek, MD Primary Electrophysiologist: None   Past Medical History    Past Medical History:  Diagnosis Date   ADHD (attention deficit hyperactivity disorder)    full eval as an adult though [facility] (not stimulant candidate but can try stratttera)   Atrial fibrillation [facility])    PAF/aflutter 2000; s/p ablation has been in [place]   Dizziness and giddiness    ED (erectile dysfunction)    GERD (gastroesophageal reflux disease)    Hyperlipidemia    Sleep apnea    was on cpap but never used after ENT surgery. He never had post-surg f/u   Strain of right hip adductor muscle    Unspecified essential hypertension     History of Present Illness  ETHRIDGE SOLLENBERGER  is a 68 year old male with a PMH of PAF s/p ablation 2000 in [place] , HTN, HLD, obesity, ADHD who presents today for 1 year follow-up.   Mr. Cubero was last seen on 06/12/2022 for annual follow-up.  During visit patient reported doing well and blood pressures were well-controlled.  He was continuing to suffer from migraines due to long COVID symptoms.  He reported no recurrence of atrial fibrillation and LDL cholesterol was elevated.  He was advised undergo a calcium score which was completed on 12/15/2022 that showed calcium in all 3 coronary arteries with a total score of 79.  He was started on Crestor 10 mg daily with lipids checked at 8 weeks Crestor increased to 20 mg daily.  He was recently seen by his PCP and had benazepril  increased due to elevated BP.    Patient denies chest pain, palpitations, dyspnea, PND, orthopnea, nausea, vomiting, dizziness, syncope, edema, weight gain, or early satiety.   Discussed the use of AI scribe software for clinical note transcription with the patient, who gave verbal consent to proceed.  History of Present  Illness    ***Notes:   Review of Systems  Please see the history of present illness.    All other systems reviewed and are otherwise negative except as noted above.  Physical Exam    Wt Readings from Last 3 Encounters:  06/12/22 233 lb 6.4 oz (105.9 kg)  06/03/21 229 lb (103.9 kg)  03/19/21 231 lb (104.8 kg)   CD:Uyzmz were no vitals filed for this visit.,There is no height or weight on file to calculate BMI. GEN: Well nourished, well developed in no acute distress Neck: No JVD; No carotid bruits Pulmonary: Clear to auscultation without rales, wheezing or rhonchi  Cardiovascular: Normal rate. Regular rhythm. Normal S1. Normal S2.   Murmurs: There is no murmur.  ABDOMEN: Soft, non-tender, non-distended EXTREMITIES:  No edema; No deformity   EKG/LABS/ Recent Cardiac Studies   ECG personally reviewed by me today - ***  Risk Assessment/Calculations:   {Does this patient have ATRIAL FIBRILLATION?[phone]}      No results found for: WBC, HGB, HCT, MCV, PLT Lab Results  Component Value Date   CREATININE 1.05 06/12/2022   BUN 17 06/12/2022   NA 142 06/12/2022   K 4.0 06/12/2022   CL 105 06/12/2022   CO2 23 06/12/2022   No results found for: CHOL, HDL, LDLCALC, LDLDIRECT, TRIG, CHOLHDL  No results found for: HGBA1C Assessment & Plan    Assessment and Plan Assessment & Plan     1.  Paroxysmal AF: -Patient reports no recurrence since previous visit. -EKG completed today showing sinus rhythm and rate of 80 bpm. -Patient is tolerating flecainide  with no adverse reactions. -Patient is currently not on oral anticoagulation.  - Continue ASA 81 mg   2.  Essential hypertension: -Patient's blood pressure today was well-controlled at 112/70 -Continue Lotensin  HCTZ 20-25 mg daily, continue HCTZ 12.5 mg daily   3.  Obesity: -Patient's BMI is 35.87 kg/m -He is currently working on increasing his physical activity and has made some dietary  changes.     4.  Hyperlipidemia: -Patient's last LDL cholesterol was 162 and labs have been requested from his PCP. -He is currently not on statin therapy -Patient will complete calcium scoring and rule out possible coronary disease. -BMET today      Disposition: Follow-up with Candyce Reek, MD or APP in *** months {Are you ordering a CV Procedure (e.g. stress test, cath, DCCV, TEE, etc)?   Press F2        :[phone]}   Signed, Wyn Raddle, Jackee Shove, NP 08/09/2023, 11:21 AM [facility]

## 2023-08-10 ENCOUNTER — Ambulatory Visit: Attending: Nurse Practitioner | Admitting: Nurse Practitioner

## 2023-08-10 ENCOUNTER — Encounter: Payer: Self-pay | Admitting: Nurse Practitioner

## 2023-08-10 VITALS — BP 134/72 | HR 78 | Ht 67.0 in | Wt 252.0 lb

## 2023-08-10 DIAGNOSIS — E66811 Obesity, class 1: Secondary | ICD-10-CM

## 2023-08-10 DIAGNOSIS — E782 Mixed hyperlipidemia: Secondary | ICD-10-CM | POA: Diagnosis not present

## 2023-08-10 DIAGNOSIS — E785 Hyperlipidemia, unspecified: Secondary | ICD-10-CM

## 2023-08-10 DIAGNOSIS — I480 Paroxysmal atrial fibrillation: Secondary | ICD-10-CM | POA: Diagnosis not present

## 2023-08-10 DIAGNOSIS — I10 Essential (primary) hypertension: Secondary | ICD-10-CM

## 2023-08-10 DIAGNOSIS — F3341 Major depressive disorder, recurrent, in partial remission: Secondary | ICD-10-CM | POA: Diagnosis not present

## 2023-08-10 DIAGNOSIS — Z125 Encounter for screening for malignant neoplasm of prostate: Secondary | ICD-10-CM | POA: Diagnosis not present

## 2023-08-10 NOTE — Patient Instructions (Signed)
 Medication Instructions:  Your physician recommends that you continue on your current medications as directed. Please refer to the Current Medication list given to you today. *If you need a refill on your cardiac medications before your next appointment, please call your pharmacy*  Lab Work: None ordered If you have labs (blood work) drawn today and your tests are completely normal, you will receive your results only by: MyChart Message (if you have MyChart) OR A paper copy in the mail If you have any lab test that is abnormal or we need to change your treatment, we will call you to review the results.  Testing/Procedures: None ordered  Follow-Up: At [facility], you and your health needs are our priority.  As part of our continuing mission to provide you with exceptional heart care, our providers are all part of one team.  This team includes your primary Cardiologist (physician) and Advanced Practice Providers or APPs (Physician Assistants and Nurse Practitioners) who all work together to provide you with the care you need, when you need it.  Your next appointment:   6 month(s)  Provider:   Llewellyn Riles, MD    We recommend signing up for the patient portal called "MyChart".  Sign up information is provided on this After Visit Summary.  MyChart is used to connect with patients for Virtual Visits (Telemedicine).  Patients are able to view lab/test results, encounter notes, upcoming appointments, etc.  Non-urgent messages can be sent to your provider as well.   To learn more about what you can do with MyChart, go to [email or web address].   Other Instructions

## 2023-09-26 ENCOUNTER — Other Ambulatory Visit: Payer: Self-pay | Admitting: Nurse Practitioner

## 2023-10-29 DIAGNOSIS — U071 COVID-19: Secondary | ICD-10-CM | POA: Diagnosis not present

## 2023-12-21 DIAGNOSIS — G8929 Other chronic pain: Secondary | ICD-10-CM | POA: Diagnosis not present

## 2023-12-21 DIAGNOSIS — U099 Post covid-19 condition, unspecified: Secondary | ICD-10-CM | POA: Diagnosis not present

## 2023-12-21 DIAGNOSIS — R519 Headache, unspecified: Secondary | ICD-10-CM | POA: Diagnosis not present

## 2023-12-29 DIAGNOSIS — F3341 Major depressive disorder, recurrent, in partial remission: Secondary | ICD-10-CM | POA: Diagnosis not present

## 2023-12-29 DIAGNOSIS — R519 Headache, unspecified: Secondary | ICD-10-CM | POA: Diagnosis not present

## 2023-12-29 DIAGNOSIS — E782 Mixed hyperlipidemia: Secondary | ICD-10-CM | POA: Diagnosis not present

## 2024-01-14 DIAGNOSIS — M25562 Pain in left knee: Secondary | ICD-10-CM | POA: Diagnosis not present
